# Patient Record
Sex: Male | Born: 1950 | Race: White | Hispanic: No | Marital: Single | Smoking: Former smoker
Health system: Southern US, Community
[De-identification: ages and names within clinical notes are randomized; demographics above are authoritative.]

## PROBLEM LIST (undated history)

## (undated) DIAGNOSIS — M869 Osteomyelitis, unspecified: Secondary | ICD-10-CM

## (undated) DIAGNOSIS — E119 Type 2 diabetes mellitus without complications: Secondary | ICD-10-CM

## (undated) DIAGNOSIS — I739 Peripheral vascular disease, unspecified: Secondary | ICD-10-CM

## (undated) DIAGNOSIS — I10 Essential (primary) hypertension: Secondary | ICD-10-CM

## (undated) HISTORY — PX: TOE AMPUTATION: SHX809

## (undated) HISTORY — PX: TONSILLECTOMY: SUR1361

## (undated) HISTORY — DX: Type 2 diabetes mellitus without complications: E11.9

## (undated) HISTORY — DX: Essential (primary) hypertension: I10

## (undated) HISTORY — DX: Peripheral vascular disease, unspecified: I73.9

---

## 1975-12-31 HISTORY — PX: OPEN TREATMENT ORBITAL FLOOR BLOWOUT: SUR910

## 2015-09-29 ENCOUNTER — Ambulatory Visit (HOSPITAL_COMMUNITY)
Admission: RE | Admit: 2015-09-29 | Discharge: 2015-09-29 | Disposition: A | Discharge status: Home / Self Care | Payer: 59 | Source: Ambulatory Visit | Attending: Family Medicine | Admitting: Family Medicine

## 2015-09-29 ENCOUNTER — Other Ambulatory Visit (HOSPITAL_COMMUNITY): Payer: Self-pay | Admitting: Family Medicine

## 2015-09-29 DIAGNOSIS — Z09 Encounter for follow-up examination after completed treatment for conditions other than malignant neoplasm: Secondary | ICD-10-CM | POA: Insufficient documentation

## 2015-09-29 DIAGNOSIS — Z89412 Acquired absence of left great toe: Secondary | ICD-10-CM | POA: Insufficient documentation

## 2015-09-29 DIAGNOSIS — S98312A Complete traumatic amputation of left midfoot, initial encounter: Secondary | ICD-10-CM

## 2015-12-31 HISTORY — PX: LEG AMPUTATION BELOW KNEE: SHX694

## 2016-08-30 ENCOUNTER — Telehealth (HOSPITAL_COMMUNITY): Payer: Self-pay

## 2016-08-30 NOTE — Telephone Encounter (Signed)
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  I spoke to him and he was just discharged from a medical center today in [place].  He would be coming back home to [place] on 9/3 and had a drs appt with his primary care dr on Tuesday, 9/5.  He said that he figured the dr would look at the knee to make sure everything was healing properly and he would have staples for some time and unsure when he could begin therapy.  I told him that I would scan the paperwork into [employer] and make a note and when he was ready for therapy and decided to come here he could give us a call.

## 2016-09-19 ENCOUNTER — Encounter: Payer: Self-pay | Admitting: Surgery

## 2016-09-25 ENCOUNTER — Encounter: Payer: Self-pay | Admitting: Surgery

## 2016-10-01 ENCOUNTER — Encounter: Payer: Self-pay | Admitting: Surgery

## 2016-10-01 ENCOUNTER — Other Ambulatory Visit: Payer: Self-pay | Admitting: *Deleted

## 2016-10-02 ENCOUNTER — Encounter: Payer: Self-pay | Admitting: Surgery

## 2016-10-02 ENCOUNTER — Ambulatory Visit (INDEPENDENT_AMBULATORY_CARE_PROVIDER_SITE_OTHER): Payer: 59 | Admitting: Surgery

## 2016-10-02 VITALS — BP 140/88 | HR 94 | Temp 98.3°F | Resp 16 | Ht 71.5 in | Wt 155.0 lb

## 2016-10-02 DIAGNOSIS — I70232 Atherosclerosis of native arteries of right leg with ulceration of calf: Secondary | ICD-10-CM

## 2016-10-02 NOTE — Progress Notes (Signed)
[facility]  Patient name: Donald Duran Greening MRN: [id number] DOB: 02/05/1951 Sex: male  REFERRING PHYSICIAN: Dr. Selena BattenKim  REASON FOR CONSULT: Vascular care  HPI: Donald Duran Keeny is a 65 y.o. male, who is referred today for establishing vascular care.  The patient is a Naval architecttruck driver and recently was traveling to [place] where he developed a severe infection of his left leg which led to a 7 day hospital stay and a left below knee amputation.  He reports having had a toe amputation done on the left leg several years ago.  He also has had a ulcer on his right foot which was able to heal.  The patient suffers from diabetes.  He states that his blood sugars range from 108-135.  He is hypertensive but states that his blood pressure is controlled on no medication.  He does have a history of MRSA.  There is a long history of diabetes in his family.  He does complain of neuropathy.  Past Medical History:  Diagnosis Date  . Diabetes mellitus without complication (HCC)   . Hypertension   . Peripheral vascular disease (HCC)     Family History  Problem Relation Age of Onset  . Diabetes Mother   . Cancer Father     SOCIAL HISTORY: Social History   Social History  . Marital status: Single    Spouse name: N/A  . Number of children: N/A  . Years of education: N/A   Occupational History  . Not on file.   Social History Main Topics  . Smoking status: Passive Smoke Exposure - Never Smoker  . Smokeless tobacco: Never Used  . Alcohol use No  . Drug use: No  . Sexual activity: Not on file   Other Topics Concern  . Not on file   Social History Narrative  . No narrative on file    No Known Allergies  Current Outpatient Prescriptions  Medication Sig Dispense Refill  . insulin glargine (LANTUS) 100 UNIT/ML injection Inject into the skin at bedtime.    . metFORMIN (GLUCOPHAGE) 500 MG tablet Take 500 mg by mouth 2 (two) times  daily with a meal.    . traMADol (ULTRAM) 50 MG tablet Take by mouth every 6 (six) hours as needed.     No current facility-administered medications for this visit.     REVIEW OF SYSTEMS:  [X]  denotes positive finding, [ ]  denotes negative finding Cardiac  Comments:  Chest pain or chest pressure:    Shortness of breath upon exertion:    Short of breath when lying flat:    Irregular heart rhythm:        Vascular    Pain in calf, thigh, or hip brought on by ambulation:    Pain in feet at night that wakes you up from your sleep:     Blood clot in your veins:    Leg swelling:         Pulmonary    Oxygen at home:    Productive cough:     Wheezing:         Neurologic    Sudden weakness in arms or legs:     Sudden numbness in arms or legs:     Sudden onset of difficulty speaking or slurred speech:    Temporary loss of vision in one eye:     Problems with dizziness:         Gastrointestinal    Blood in stool:  Vomited blood:         Genitourinary    Burning when urinating:     Blood in urine:        Psychiatric    Major depression:         Hematologic    Bleeding problems:    Problems with blood clotting too easily:        Skin    Rashes or ulcers:        Constitutional    Fever or chills:      PHYSICAL EXAM: Vitals:   10/02/16 1302  BP: 140/88  Pulse: 94  Resp: 16  Temp: 98.3 F (36.8 C)  TempSrc: Oral  SpO2: 98%  Weight: 155 lb (70.3 kg)  Height: 5' 11.5" (1.816 m)    GENERAL: The patient is a well-nourished male, in no acute distress. The vital signs are documented above. CARDIAC: There is a regular rate and rhythm.  VASCULAR: Left below knee amputation is healing appropriately.  There is a eschar over the medial aspect of the incision that has not completely healed.  There is minimal edema.  He can straighten his leg completely.  He has a palpable dorsalis pedis pulse on the right. PULMONARY: There is good air exchange bilaterally without wheezing  or rales. ABDOMEN: Soft and non-tender with normal pitched bowel sounds.  MUSCULOSKELETAL: There are no major deformities or cyanosis. NEUROLOGIC: No focal weakness or paresthesias are detected. SKIN: There are no ulcers or rashes noted. PSYCHIATRIC: The patient has a normal affect.  DATA:  None  ASSESSMENT AND PLAN: History of left below-knee amputation: The patient will be referred to [facility] for discussions regarding stump shrinker and prosthetics.  He will follow-up with me in 1 year for ankle-brachial indices.  I discussed the signs and symptoms for him to look out for so we can avoid having this happen to his right leg.  Given the family history of diabetes and the rather abrupt nature at which she lost his leg, he would probably benefit from a full cardiac workup as well to make sure he doesn't have silent coronary disease.  I would also recommend checking a cholesterol profile starting him on a statin as well as antiplatelet therapy with an aspirin.   Durene Cal, MD [facility] Special[facility] [phone] Pager [phone]

## 2016-10-04 NOTE — Addendum Note (Signed)
Addended by: Yolonda KidaEVANS, ASHLEIGH N on: 10/04/2016 02:16 PM   Modules accepted: Orders

## 2016-10-23 DIAGNOSIS — Z0279 Encounter for issue of other medical certificate: Secondary | ICD-10-CM | POA: Diagnosis not present

## 2017-06-10 LAB — LIPID PANEL
CHOLESTEROL: 154 (ref 0–200)
HDL: 24 — AB (ref 35–70)
Triglycerides: 409 — AB (ref 40–160)

## 2017-06-10 LAB — TSH: TSH: 2.7 (ref <=5.90)

## 2017-06-10 LAB — HEMOGLOBIN A1C: Hemoglobin A1C: 6.8

## 2017-06-10 LAB — BASIC METABOLIC PANEL
BUN: 18 (ref 4–21)
Creatinine: 0.9 (ref <=1.3)

## 2017-08-13 ENCOUNTER — Ambulatory Visit (INDEPENDENT_AMBULATORY_CARE_PROVIDER_SITE_OTHER): Payer: Self-pay | Admitting: "Endocrinology

## 2017-08-13 ENCOUNTER — Encounter: Payer: Self-pay | Admitting: "Endocrinology

## 2017-08-13 VITALS — BP 135/76 | HR 82 | Ht 71.5 in | Wt 181.0 lb

## 2017-08-13 DIAGNOSIS — I10 Essential (primary) hypertension: Secondary | ICD-10-CM | POA: Insufficient documentation

## 2017-08-13 DIAGNOSIS — E1165 Type 2 diabetes mellitus with hyperglycemia: Secondary | ICD-10-CM

## 2017-08-13 DIAGNOSIS — Z794 Long term (current) use of insulin: Secondary | ICD-10-CM

## 2017-08-13 DIAGNOSIS — E1169 Type 2 diabetes mellitus with other specified complication: Secondary | ICD-10-CM

## 2017-08-13 DIAGNOSIS — IMO0002 Reserved for concepts with insufficient information to code with codable children: Secondary | ICD-10-CM | POA: Insufficient documentation

## 2017-08-13 DIAGNOSIS — E782 Mixed hyperlipidemia: Secondary | ICD-10-CM

## 2017-08-13 NOTE — Progress Notes (Signed)
Subjective:    Patient ID: Donald Duran, male    DOB: [birth year]. Patient is being seen in consultation for management of diabetes requested by  Hattie Perch, FNP  Past Medical History:  Diagnosis Date  . Diabetes mellitus without complication (HCC)   . Hypertension   . Peripheral vascular disease [facility])    Past Surgical History:  Procedure Laterality Date  . LEG AMPUTATION BELOW KNEE Left 2017   Done at [facility] in [place] by Dr. Adan Sis  . OPEN TREATMENT ORBITAL FLOOR BLOWOUT  1977  . TOE AMPUTATION Left   . TONSILLECTOMY     Social History   Social History  . Marital status: Single    Spouse name: N/A  . Number of children: N/A  . Years of education: N/A   Social History Main Topics  . Smoking status: Passive Smoke Exposure - Never Smoker  . Smokeless tobacco: Never Used  . Alcohol use No  . Drug use: No  . Sexual activity: Not Asked   Other Topics Concern  . None   Social History Narrative  . None   Outpatient Encounter Prescriptions as of 08/13/2017  Medication Sig  . atorvastatin (LIPITOR) 20 MG tablet Take 20 mg by mouth daily.  . Insulin Glargine (BASAGLAR KWIKPEN [place]) Inject 50 Units into the skin at bedtime.  Marland Kitchen lisinopril (PRINIVIL,ZESTRIL) 10 MG tablet Take 10 mg by mouth daily.  . metFORMIN (GLUCOPHAGE) 500 MG tablet Take 500 mg by mouth 2 (two) times daily with a meal.  . [DISCONTINUED] insulin glargine (LANTUS) 100 UNIT/ML injection Inject into the skin at bedtime.  . [DISCONTINUED] traMADol (ULTRAM) 50 MG tablet Take by mouth every 6 (six) hours as needed.   No facility-administered encounter medications on file as of 08/13/2017.    ALLERGIES: No Known Allergies VACCINATION STATUS:  There is no immunization history on file for this patient.  Diabetes  He presents for his initial diabetic visit. He has type 2 diabetes mellitus. Onset time: He was diagnosed at approximate age of 27 years. His disease course has been  improving (He did have A1c of 9% last year due to labs in his insurance and not affording medications, most recently in June 2018 A1c was 6.8%.). There are no hypoglycemic associated symptoms. Pertinent negatives for hypoglycemia include no confusion, headaches, pallor or seizures. There are no diabetic associated symptoms. Pertinent negatives for diabetes include no chest pain, no fatigue, no polydipsia, no polyphagia, no polyuria and no weakness. There are no hypoglycemic complications. Symptoms are improving. Diabetic complications include PVD. (Patient is status post complicated by MRSA infection leading to amputation of left lower extremity in August 2017 currently wearing prosthetic leg.) Risk factors for coronary artery disease include dyslipidemia, diabetes mellitus and male sex. Current diabetic treatment includes insulin injections and oral agent (monotherapy). His weight is increasing steadily. He is following a generally unhealthy diet. When asked about meal planning, he reported none. He has not had a previous visit with a dietitian. He participates in exercise intermittently. His overall blood glucose range is 140-180 mg/dl. (He did  bring a logs from June 2016 showing average blood glucose between 140 and 180. He admits he does not monitor blood glucose regularly. His A1c was 6.8% on 06/14/2017.) An ACE inhibitor/angiotensin II receptor blocker is being taken. He does not see a podiatrist.Eye exam is not current (He does not have insurance to see an eye doctor.).  Hyperlipidemia  This is a chronic problem.  The current episode started more than 1 year ago. Exacerbating diseases include diabetes. Pertinent negatives include no chest pain, myalgias or shortness of breath. Current antihyperlipidemic treatment includes statins. Risk factors for coronary artery disease include dyslipidemia, diabetes mellitus, hypertension, male sex and a sedentary lifestyle.  Hypertension  This is a chronic problem. The  current episode started more than 1 year ago. The problem is controlled. Pertinent negatives include no chest pain, headaches, neck pain, palpitations or shortness of breath. Risk factors for coronary artery disease include dyslipidemia, diabetes mellitus, family history, male gender and sedentary lifestyle. Past treatments include ACE inhibitors. Hypertensive end-organ damage includes PVD.    Recent Results (from the past 2160 hour(s))  Basic metabolic panel     Status: None   Collection Time: 06/10/17 12:00 AM  Result Value Ref Range   BUN 18 4 - 21   Creatinine 0.9 0.6 - 1.3  Lipid panel     Status: Abnormal   Collection Time: 06/10/17 12:00 AM  Result Value Ref Range   Triglycerides 409 (A) 40 - 160   Cholesterol 154 0 - 200   HDL 24 (A) 35 - 70  Hemoglobin A1c     Status: None   Collection Time: 06/10/17 12:00 AM  Result Value Ref Range   Hemoglobin A1C 6.8   TSH     Status: None   Collection Time: 06/10/17 12:00 AM  Result Value Ref Range   TSH 2.70 0.41 - 5.90   His vitamin D was normal at 32.5 on 06/10/2017.   Review of Systems  Constitutional: Negative for chills, fatigue, fever and unexpected weight change.  HENT: Negative for dental problem, mouth sores and trouble swallowing.   Eyes: Negative for visual disturbance.  Respiratory: Negative for cough, choking, chest tightness, shortness of breath and wheezing.   Cardiovascular: Negative for chest pain, palpitations and leg swelling.  Gastrointestinal: Negative for abdominal distention, abdominal pain, constipation, diarrhea, nausea and vomiting.  Endocrine: Negative for polydipsia, polyphagia and polyuria.  Genitourinary: Negative for dysuria, flank pain, hematuria and urgency.  Musculoskeletal: Positive for gait problem. Negative for back pain, myalgias and neck pain.       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  Skin: Negative for pallor, rash and wound.  Neurological: Negative for seizures, syncope,  weakness, numbness and headaches.  Psychiatric/Behavioral: Negative.  Negative for confusion and dysphoric mood.    Objective:    BP 135/76   Pulse 82   Ht 5' 11.5" (1.816 m)   Wt 181 lb (82.1 kg)   BMI 24.89 kg/m   Wt Readings from Last 3 Encounters:  08/13/17 181 lb (82.1 kg)  10/02/16 155 lb (70.3 kg)    Physical Exam  Constitutional: He is oriented to person, place, and time. He appears well-developed and well-nourished. He is cooperative. No distress.  HENT:  Head: Normocephalic and atraumatic.  Eyes: EOM are normal.  Neck: Normal range of motion. Neck supple. No tracheal deviation present. No thyromegaly present.  Cardiovascular: Normal rate, S1 normal, S2 normal and normal heart sounds.  Exam reveals no gallop.   No murmur heard. Pulses:      Dorsalis pedis pulses are 1+ on the right side, and 1+ on the left side.       Posterior tibial pulses are 1+ on the right side, and 1+ on the left side.  Pulmonary/Chest: Breath sounds normal. No respiratory distress. He has no wheezes.  Abdominal: Soft. Bowel sounds are normal. He exhibits no distension.  There is no tenderness. There is no guarding and no CVA tenderness.  Musculoskeletal: He exhibits no edema.       Right shoulder: He exhibits no swelling and no deformity.  [name] with prosthetic leg, diminished pulses on right lower somebody.  Neurological: He is alert and oriented to person, place, and time. He has normal strength and normal reflexes. No cranial nerve deficit or sensory deficit. Gait normal.  Diminished monofilament response on right lower extremity.  Skin: Skin is warm and dry. No rash noted. No cyanosis. Nails show no clubbing.  Psychiatric: He has a normal mood and affect. His speech is normal. Judgment normal. Cognition and memory are normal.      Assessment & Plan:   1. Uncontrolled type 2 diabetes mellitus with other specified complication, with long-term current use of insulin (HCC)  - Patient has  currently uncontrolled symptomatic type 2 DM since  66 years of age. - His most recent A1c was 6.8% progressively improving from 9% last year. - Recent labs reviewed.  -his diabetes is complicated by severe peripheral diabetes which led to amputation of his left lower extremity, lack of appropriate insurance, peripheral neuropathy and Klye Besecker remains at a high risk for more acute and chronic complications which include CAD, CVA, CKD, retinopathy, and neuropathy. These are all discussed in detail with the patient.  - I have counseled him on diet management and weight control, by adopting a carbohydrate restricted/protein rich diet.  - Suggestion is made for him to avoid simple carbohydrates  from his diet including Cakes, Sweet Desserts, Ice Cream, Soda (diet and regular), Sweet Tea, Candies, Chips, Cookies, Store Bought Juices, Alcohol in Excess of  1-2 drinks a day, Artificial Sweeteners, and "Sugar-free" Products. This will help patient to have stable blood glucose profile and potentially avoid unintended weight gain.  - I encouraged him to switch to  unprocessed or minimally processed complex starch and increased protein intake (animal or plant source), fruits, and vegetables.  - he is advised to stick to a routine mealtimes to eat 3 meals  a day and avoid unnecessary snacks ( to snack only to correct hypoglycemia).   - he will be scheduled with Norm Salt, RDN, CDE for individualized DM education.  - I have approached him with the following individualized plan to manage diabetes and patient agrees:   -  Based on his recent A1c of 6.8%, he would not require additional insulin therapy.  - I have advised him to continue his basal insulin  Basaglar 50 units daily at bedtime, associated with  strict monitoring of glucose 4 times a day-before meals and at bedtime. - She will return in 1 week with his meter and logs for reevaluation. - Patient is warned not to take insulin without  proper monitoring per orders. -Patient is encouraged to call clinic for blood glucose levels less than 70 or above 300 mg /dl. - I will continue metformin 1000 g by mouth twice a day, therapeutically suitable for patient .  -Patient is not a candidate for  SGLT2 inhibitors due to PAD.  - Patient specific target  A1c;  LDL, HDL, Triglycerides, and  Waist Circumference were discussed in detail.  2) BP/HTN: Controlled. Continue current medications including ACEI/ARB. 3) Lipids/HPL:   Uncontrolled with hypertriglyceridemia over 409.   Patient is advised to continue statins. I Have advised him to avoid butter and fried food. 4)  Weight/Diet: CDE Consult will be initiated , exercise, and detailed carbohydrates information provided.  5) Chronic Care/Health Maintenance:  -he  is on ACEI/ARB and Statin medications and  is encouraged to continue to follow up with Ophthalmology, Dentist,  Podiatrist at least yearly or according to recommendations, and advised to  stay away from smoking. I have recommended yearly flu vaccine and pneumonia vaccination at least every 5 years; moderate intensity exercise for up to 150 minutes weekly; and  sleep for at least 7 hours a day. - From diabetes point of view, he is clear for work while taking care of his diabetes based on current plan. 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 Moreover, he will need ophthalmology evaluation as soon as possible.  - Time spent with the patient: 1 hour, of which >50% was spent in obtaining information about his symptoms, reviewing his previous labs, evaluations, and treatments, counseling him about his condition (please see the discussed topics above), and developing a plan for long term treatment; he had a number of questions which I addressed.  - Patient to bring meter and  blood glucose logs during his next visit.  - I advised  patient to maintain close follow up with Hattie Percharter, Joanna L, FNP for primary care needs.  Follow up plan: - Return in about 1 week (around 08/20/2017) for meter, and logs, follow up with meter and logs- no labs.  Marquis LunchGebre Rita Prom, MD Phone: [phone]  Fax: [phone]   08/13/2017, 5:31 PM

## 2017-08-13 NOTE — Patient Instructions (Signed)

## 2017-08-22 ENCOUNTER — Ambulatory Visit: Payer: Self-pay | Admitting: "Endocrinology

## 2017-08-29 ENCOUNTER — Ambulatory Visit: Payer: Self-pay | Admitting: "Endocrinology

## 2017-09-02 ENCOUNTER — Encounter: Payer: Self-pay | Admitting: "Endocrinology

## 2017-09-02 ENCOUNTER — Ambulatory Visit (INDEPENDENT_AMBULATORY_CARE_PROVIDER_SITE_OTHER): Payer: Self-pay | Admitting: "Endocrinology

## 2017-09-02 VITALS — BP 131/75 | HR 86 | Ht 71.5 in | Wt 183.0 lb

## 2017-09-02 DIAGNOSIS — E1169 Type 2 diabetes mellitus with other specified complication: Secondary | ICD-10-CM

## 2017-09-02 DIAGNOSIS — IMO0002 Reserved for concepts with insufficient information to code with codable children: Secondary | ICD-10-CM

## 2017-09-02 DIAGNOSIS — I10 Essential (primary) hypertension: Secondary | ICD-10-CM

## 2017-09-02 DIAGNOSIS — E1165 Type 2 diabetes mellitus with hyperglycemia: Secondary | ICD-10-CM

## 2017-09-02 DIAGNOSIS — E782 Mixed hyperlipidemia: Secondary | ICD-10-CM

## 2017-09-02 DIAGNOSIS — Z794 Long term (current) use of insulin: Secondary | ICD-10-CM

## 2017-09-02 NOTE — Progress Notes (Signed)
Subjective:    Patient ID: Donald Duran, male    DOB: 01/09/1951. Patient is being seen in f/u for management of diabetes requested by  Hattie Percharter, Joanna L, FNP  Past Medical History:  Diagnosis Date  . Diabetes mellitus without complication (HCC)   . Hypertension   . Peripheral vascular disease [facility](HCC)    Past Surgical History:  Procedure Laterality Date  . LEG AMPUTATION BELOW KNEE Left 2017   [facility] in [place] by Dr. Adan SisJeremy Timmer  . OPEN TREATMENT ORBITAL FLOOR BLOWOUT  1977  . TOE AMPUTATION Left   . TONSILLECTOMY     Social History   Social History  . Marital status: Single    Spouse name: N/A  . Number of children: N/A  . Years of education: N/A   Social History Main Topics  . Smoking status: Passive Smoke Exposure - Never Smoker  . Smokeless tobacco: Never Used  . Alcohol use No  . Drug use: No  . Sexual activity: Not on file   Other Topics Concern  . Not on file   Social History Narrative  . No narrative on file   Outpatient Encounter Prescriptions as of 09/02/2017  Medication Sig  . atorvastatin (LIPITOR) 20 MG tablet Take 20 mg by mouth daily.  . Insulin Glargine (BASAGLAR KWIKPEN [place]) Inject 40 Units into the skin at bedtime.  Marland Kitchen. lisinopril (PRINIVIL,ZESTRIL) 10 MG tablet Take 10 mg by mouth daily.  . metFORMIN (GLUCOPHAGE) 500 MG tablet Take 500 mg by mouth 2 (two) times daily with a meal.   No facility-administered encounter medications on file as of 09/02/2017.    ALLERGIES: No Known Allergies VACCINATION STATUS:  There is no immunization history on file for this patient.  Diabetes  He presents for his initial diabetic visit. He has type 2 diabetes mellitus. Onset time: He was diagnosed at approximate age of 66 years. His disease course has been stable (He did have A1c of 9% last year due to labs in his insurance and not affording medications, most recently in June 2018 A1c was 6.8%.). There are no hypoglycemic associated  symptoms. Pertinent negatives for hypoglycemia include no confusion, headaches, pallor or seizures. There are no diabetic associated symptoms. Pertinent negatives for diabetes include no chest pain, no fatigue, no polydipsia, no polyphagia, no polyuria and no weakness. There are no hypoglycemic complications. Symptoms are stable. Diabetic complications include PVD. 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 Current diabetic treatment includes insulin injections and oral agent (monotherapy). His weight is increasing steadily. He is following a generally unhealthy diet. When asked about meal planning, he reported none. He has not had a previous visit with a dietitian. He participates in exercise intermittently. His breakfast blood glucose range is generally 90-110 mg/dl. His lunch blood glucose range is generally 90-110 mg/dl. His dinner blood glucose range is generally 90-110 mg/dl. His bedtime blood glucose range is generally 110-130 mg/dl. His overall blood glucose range is 90-110 mg/dl. (He did  bring a logs from June 2016 showing average blood glucose between 140 and 180. He admits he does not monitor blood glucose regularly. His A1c was 6.8% on 06/14/2017.) An ACE inhibitor/angiotensin II receptor blocker is being taken. He does not see a podiatrist.Eye exam is not current (He does not have insurance to see an eye  doctor.).  Hyperlipidemia  This is a chronic problem. The current episode started more than 1 year ago. Exacerbating diseases include diabetes. Pertinent negatives include no chest pain, myalgias or shortness of breath. Current antihyperlipidemic treatment includes statins. Risk factors for coronary artery disease include dyslipidemia, diabetes mellitus, hypertension, male sex and a sedentary lifestyle.   Hypertension  This is a chronic problem. The current episode started more than 1 year ago. The problem is controlled. Pertinent negatives include no chest pain, headaches, neck pain, palpitations or shortness of breath. Risk factors for coronary artery disease include dyslipidemia, diabetes mellitus, family history, male gender and sedentary lifestyle. Past treatments include ACE inhibitors. Hypertensive end-organ damage includes PVD.    Recent Results (from the past 2160 hour(s))  Basic metabolic panel     Status: None   Collection Time: 06/10/17 12:00 AM  Result Value Ref Range   BUN 18 4 - 21   Creatinine 0.9 0.6 - 1.3  Lipid panel     Status: Abnormal   Collection Time: 06/10/17 12:00 AM  Result Value Ref Range   Triglycerides 409 (A) 40 - 160   Cholesterol 154 0 - 200   HDL 24 (A) 35 - 70  Hemoglobin A1c     Status: None   Collection Time: 06/10/17 12:00 AM  Result Value Ref Range   Hemoglobin A1C 6.8   TSH     Status: None   Collection Time: 06/10/17 12:00 AM  Result Value Ref Range   TSH 2.70 0.41 - 5.90   His vitamin D was normal at 32.5 on 06/10/2017.   Review of Systems  Constitutional: Negative for chills, fatigue, fever and unexpected weight change.  HENT: Negative for dental problem, mouth sores and trouble swallowing.   Eyes: Negative for visual disturbance.  Respiratory: Negative for cough, choking, chest tightness, shortness of breath and wheezing.   Cardiovascular: Negative for chest pain, palpitations and leg swelling.  Gastrointestinal: Negative for abdominal distention, abdominal pain, constipation, diarrhea, nausea and vomiting.  Endocrine: Negative for polydipsia, polyphagia and polyuria.  Genitourinary: Negative for dysuria, flank pain, hematuria and urgency.  Musculoskeletal: Positive for gait problem. Negative for back pain, myalgias and neck pain.       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  Skin: Negative for pallor, rash and wound.   Neurological: Negative for seizures, syncope, weakness, numbness and headaches.  Psychiatric/Behavioral: Negative.  Negative for confusion and dysphoric mood.    Objective:    BP 131/75   Pulse 86   Ht 5' 11.5" (1.816 m)   Wt 183 lb (83 kg)   BMI 25.17 kg/m   Wt Readings from Last 3 Encounters:  09/02/17 183 lb (83 kg)  08/13/17 181 lb (82.1 kg)  10/02/16 155 lb (70.3 kg)    Physical Exam  Constitutional: He is oriented to person, place, and time. He appears well-developed and well-nourished. He is cooperative. No distress.  HENT:  Head: Normocephalic and atraumatic.  Eyes: EOM are normal.  Neck: Normal range of motion. Neck supple. No tracheal deviation present. No thyromegaly present.  Cardiovascular: Normal rate, S1 normal, S2 normal and normal heart sounds.  Exam reveals no gallop.   No murmur heard. Pulses:      Dorsalis pedis pulses are 1+ on the right side, and 1+ on the left side.       Posterior tibial pulses are 1+ on the right side, and 1+ on the left side.  Pulmonary/Chest: Breath sounds normal. No respiratory distress.  He has no wheezes.  Abdominal: Soft. Bowel sounds are normal. He exhibits no distension. There is no tenderness. There is no guarding and no CVA tenderness.  Musculoskeletal: He exhibits no edema.       Right shoulder: He exhibits no swelling and no deformity.  [name] with prosthetic leg, diminished pulses on right lower somebody.  Neurological: He is alert and oriented to person, place, and time. He has normal strength and normal reflexes. No cranial nerve deficit or sensory deficit. Gait normal.  Diminished monofilament response on right lower extremity.  Skin: Skin is warm and dry. No rash noted. No cyanosis. Nails show no clubbing.  Psychiatric: He has a normal mood and affect. His speech is normal. Judgment normal. Cognition and memory are normal.   Recent Results (from the past 2160 hour(s))  Basic metabolic panel     Status: None    Collection Time: 06/10/17 12:00 AM  Result Value Ref Range   BUN 18 4 - 21   Creatinine 0.9 0.6 - 1.3  Lipid panel     Status: Abnormal   Collection Time: 06/10/17 12:00 AM  Result Value Ref Range   Triglycerides 409 (A) 40 - 160   Cholesterol 154 0 - 200   HDL 24 (A) 35 - 70  Hemoglobin A1c     Status: None   Collection Time: 06/10/17 12:00 AM  Result Value Ref Range   Hemoglobin A1C 6.8   TSH     Status: None   Collection Time: 06/10/17 12:00 AM  Result Value Ref Range   TSH 2.70 0.41 - 5.90      Assessment & Plan:   1. Uncontrolled type 2 diabetes mellitus with other specified complication, with long-term current use of insulin (HCC)  - Patient has currently uncontrolled symptomatic type 2 DM since  66 years of age. - His most recent A1c was 6.8% progressively improving from 9% last year. He came with on target blood glucose profile for the last 7 days. - Recent labs reviewed.  -his diabetes is complicated by severe peripheral diabetes which led to amputation of his left lower extremity, lack of appropriate insurance, peripheral neuropathy and [place] Arenson remains at a high risk for more acute and chronic complications which include CAD, CVA, CKD, retinopathy, and neuropathy. These are all discussed in detail with the patient.  - I have counseled him on diet management and weight control, by adopting a carbohydrate restricted/protein rich diet.  -Suggestion is made for him to avoid simple carbohydrates  from his diet including Cakes, Sweet Desserts, Ice Cream, Soda (diet and regular), Sweet Tea, Candies, Chips, Cookies, Store Bought Juices, Alcohol in Excess of  1-2 drinks a day, Artificial Sweeteners, and "Sugar-free" Products. This will help patient to have stable blood glucose profile and potentially avoid unintended weight gain.   - I encouraged him to switch to  unprocessed or minimally processed complex starch and increased protein intake (animal or plant source),  fruits, and vegetables.  - he is advised to stick to a routine mealtimes to eat 3 meals  a day and avoid unnecessary snacks ( to snack only to correct hypoglycemia).   - he will be scheduled with Norm Salt, RDN, CDE for individualized DM education.  - I have approached him with the following individualized plan to manage diabetes and patient agrees:   -  Based on his recent A1c of 6.8%, he would not require additional insulin therapy.  - His overall readings of blood  glucose for the last 7 days are between 90 and 120 mg/dL. He had no hypoglycemia episodes. - I have advised him to lower his basal insulin  Basaglar to 40 units daily at bedtime, associated with  strict monitoring of glucose 2 times a day-before breakfast and at bedtime.  -Patient is encouraged to call clinic for blood glucose levels less than 70 or above 300 mg /dl. - I will continue metformin 1000 g by mouth twice a day, therapeutically suitable for patient .  -Patient is not a candidate for  SGLT2 inhibitors due to PAD.  - Patient specific target  A1c;  LDL, HDL, Triglycerides, and  Waist Circumference were discussed in detail.  2) BP/HTN: Controlled. Continue current medications including ACEI/ARB. 3) Lipids/HPL:   Uncontrolled with hypertriglyceridemia over 409.   Patient is advised to continue statins. I Have advised him to avoid butter and fried food. 4)  Weight/Diet: CDE Consult will be initiated , exercise, and detailed carbohydrates information provided.  5) Chronic Care/Health Maintenance:  -he  is on ACEI/ARB and Statin medications and  is encouraged to continue to follow up with Ophthalmology, Dentist,  Podiatrist at least yearly or according to recommendations, and advised to  stay away from smoking. I have recommended yearly flu vaccine and pneumonia vaccination at least every 5 years; moderate intensity exercise for up to 150 minutes weekly; and  sleep for at least 7 hours a day. - From diabetes point of  view, he is clear for work while taking care of his diabetes based on current plan.  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 Moreover, he will need ophthalmology, PM&R evaluation as soon as possible. 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  - Time spent with the patient: 25 min, of which >50% was spent in reviewing his sugar logs , discussing his hypo- and hyper-glycemic episodes, reviewing his current and  previous labs and insulin doses and developing a plan to avoid hypo- and hyper-glycemia.  - Patient to bring meter and  blood glucose logs during his next visit.  - I advised patient to maintain close follow up with Hattie Perch, FNP for primary care needs.  Follow up plan: - Return in about 8 weeks (around 10/28/2017) for meter, and logs.  Marquis Lunch, MD Phone: [phone]  Fax: [phone]  This note was partially dictated with voice recognition software. Similar sounding words can be transcribed inadequately or may not  be corrected upon review.  09/02/2017, 10:51 AM

## 2017-09-02 NOTE — Patient Instructions (Signed)

## 2017-09-10 ENCOUNTER — Ambulatory Visit: Payer: Self-pay | Admitting: Physician Assistant

## 2017-09-10 ENCOUNTER — Encounter: Payer: Self-pay | Admitting: Physician Assistant

## 2017-09-10 VITALS — BP 110/64 | HR 84 | Temp 98.1°F | Ht 70.5 in | Wt 185.6 lb

## 2017-09-10 DIAGNOSIS — Z125 Encounter for screening for malignant neoplasm of prostate: Secondary | ICD-10-CM

## 2017-09-10 DIAGNOSIS — Z1211 Encounter for screening for malignant neoplasm of colon: Secondary | ICD-10-CM

## 2017-09-10 DIAGNOSIS — I10 Essential (primary) hypertension: Secondary | ICD-10-CM

## 2017-09-10 DIAGNOSIS — E118 Type 2 diabetes mellitus with unspecified complications: Secondary | ICD-10-CM

## 2017-09-10 DIAGNOSIS — Z89512 Acquired absence of left leg below knee: Secondary | ICD-10-CM

## 2017-09-10 DIAGNOSIS — Z794 Long term (current) use of insulin: Secondary | ICD-10-CM

## 2017-09-10 DIAGNOSIS — E782 Mixed hyperlipidemia: Secondary | ICD-10-CM

## 2017-09-10 DIAGNOSIS — E1142 Type 2 diabetes mellitus with diabetic polyneuropathy: Secondary | ICD-10-CM

## 2017-09-10 NOTE — Progress Notes (Signed)
BP 110/64 (BP Location: Left Arm, Patient Position: Sitting, Cuff Size: Normal)   Pulse 84   Temp 98.1 F (36.7 C)   Ht 5' 10.5" (1.791 m)   Wt 185 lb 9.6 oz (84.2 kg)   SpO2 99%   BMI 26.25 kg/m    Subjective:    Patient ID: Donald Duran, male    DOB: 1951-10-22, 66 y.o.   MRN: [id number]  HPI: Donald Duran is a 66 y.o. male presenting on 09/10/2017 for Diabetes and Hypertension   HPI   I am seeing pt at [facility] clinic as interim provider until new permanent provider takes over mid-October  Pt is here today needing an examination requested by occupational health. 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  Dr Fransico Him completed part of the paperwork.    Pt followed by dr Fransico Him for DM.    Pt has follow-up in October with vascular surgeon  Pt has appt with orthopedics for waiver due to amputation (L BKA)  Pt has never seen an eye doctor.    Pt thinks last foot exam was by dr Fransico Him at last OV and vascular surgeon last October.  He is scheduled to get an ABI ordered by dr Fransico Him of the ankle because his pulses felt different.    Pt denies hx MI, cad.    Relevant past medical, surgical, family and social history reviewed and updated as indicated. Interim medical history since our last visit reviewed. Allergies and medications reviewed and updated.   Current Outpatient Prescriptions:  .  aspirin EC 81 MG tablet, Take 81 mg by mouth daily., Disp: , Rfl:  .  atorvastatin (LIPITOR) 20 MG tablet, Take 20 mg by mouth daily., Disp: , Rfl:  .  Insulin Glargine (BASAGLAR KWIKPEN [place]), Inject 40 Units into the skin at bedtime. , Disp: , Rfl:  .  lisinopril (PRINIVIL,ZESTRIL) 10 MG tablet, Take 10 mg by mouth daily., Disp: , Rfl:  .  metFORMIN (GLUCOPHAGE) 500 MG tablet, Take 500 mg by mouth 2 (two) times daily with a meal., Disp: , Rfl:    Review of Systems  Constitutional: Negative for appetite change, chills,  diaphoresis, fatigue, fever and unexpected weight change.  HENT: Negative for congestion, dental problem, drooling, ear pain, facial swelling, hearing loss, mouth sores, sneezing, sore throat, trouble swallowing and voice change.   Eyes: Negative for pain, discharge, redness, itching and visual disturbance.  Respiratory: Negative for cough, choking, shortness of breath and wheezing.   Cardiovascular: Negative for chest pain, palpitations and leg swelling.  Gastrointestinal: Negative for abdominal pain, blood in stool, constipation, diarrhea and vomiting.  Endocrine: Negative for cold intolerance, heat intolerance and polydipsia.  Genitourinary: Negative for decreased urine volume, dysuria and hematuria.  Musculoskeletal: Negative for arthralgias, back pain and gait problem.  Skin: Negative for rash.  Allergic/Immunologic: Negative for environmental allergies.  Neurological: Negative for seizures, syncope, light-headedness and headaches.  Hematological: Negative for adenopathy.  Psychiatric/Behavioral: Negative for agitation, dysphoric mood and suicidal ideas. The patient is not nervous/anxious.     Per HPI unless specifically indicated above     Objective:    BP 110/64 (BP Location: Left Arm, Patient Position: Sitting, Cuff Size: Normal)   Pulse 84   Temp 98.1 F (36.7 C)   Ht 5' 10.5" (1.791 m)   Wt 185 lb 9.6 oz (84.2 kg)   SpO2 99%   BMI 26.25 kg/m   Wt  Readings from Last 3 Encounters:  09/10/17 185 lb 9.6 oz (84.2 kg)  09/02/17 183 lb (83 kg)  08/13/17 181 lb (82.1 kg)    Physical Exam  Constitutional: He is oriented to person, place, and time. He appears well-developed and well-nourished.  HENT:  Head: Normocephalic and atraumatic.  Eyes: Pupils are equal, round, and reactive to light. EOM and lids are normal.  Neck: Neck supple.  Cardiovascular: Normal rate and regular rhythm.   Pulses:      Dorsalis pedis pulses are 1+ on the right side.       Posterior tibial  pulses are 1+ on the right side.  Pulmonary/Chest: Effort normal and breath sounds normal. He has no wheezes.  Abdominal: Soft. Bowel sounds are normal. There is no hepatosplenomegaly. There is no tenderness.  Musculoskeletal: He exhibits no edema.  Lymphadenopathy:    He has no cervical adenopathy.  Neurological: He is alert and oriented to person, place, and time.  Skin: Skin is warm and dry.  Psychiatric: He has a normal mood and affect. His behavior is normal.  Vitals reviewed.    Foot exam done  Results for orders placed or performed in visit on 08/13/17  Basic metabolic panel  Result Value Ref Range   BUN 18 4 - 21   Creatinine 0.9 0.6 - 1.3  Lipid panel  Result Value Ref Range   Triglycerides 409 (A) 40 - 160   Cholesterol 154 0 - 200   HDL 24 (A) 35 - 70  Hemoglobin A1c  Result Value Ref Range   Hemoglobin A1C 6.8   TSH  Result Value Ref Range   TSH 2.70 0.41 - 5.90      Assessment & Plan:    Encounter Diagnoses  Name Primary?  . Controlled type 2 diabetes mellitus with complication, with long-term current use of insulin (HCC)   . Mixed hyperlipidemia   . Essential hypertension, benign   . Diabetic polyneuropathy associated with type 2 diabetes mellitus (HCC)   . S/P BKA (below knee amputation) unilateral, left (HCC)   . Screening for colon cancer   . Screening for prostate cancer Yes    -pt Needs PSA testing- lab ordered -pt Needs colon cancer screening -  iFOBT test ordered -will Refer pt for diabetic eye exam.  -pt has appointment with orthopedics to discuss his waiver -pt has regular follow up scheduled with dr Fransico HimNida for his diabetes -pt paperwork filled out and returned to him -Pt has regular follow-up scheduled here at the end of October.  He is to RTO sooner prn

## 2017-10-01 ENCOUNTER — Other Ambulatory Visit: Payer: Self-pay | Admitting: Physician Assistant

## 2017-10-01 MED ORDER — ONETOUCH LANCETS MISC: 0 refills | Status: DC

## 2017-10-01 MED ORDER — GLUCOSE BLOOD VI STRP: ORAL_STRIP | 1 refills | Status: AC

## 2017-10-02 ENCOUNTER — Other Ambulatory Visit: Payer: Self-pay | Admitting: Physician Assistant

## 2017-10-06 ENCOUNTER — Encounter (HOSPITAL_COMMUNITY): Payer: 59

## 2017-10-06 ENCOUNTER — Ambulatory Visit: Payer: Self-pay | Admitting: Family

## 2017-10-28 ENCOUNTER — Ambulatory Visit: Payer: Self-pay | Admitting: "Endocrinology

## 2017-11-17 ENCOUNTER — Telehealth: Payer: Self-pay

## 2017-11-17 NOTE — Telephone Encounter (Signed)
Called to follow up with [name] for [facility]. Client is currently active as a patient with [facility] and has an upcomming appointment this week. Client's concerns were cost of Diabetic Testing supplies. He is currently using the one touch meter and the meter from [employer] and the reli-on strips. The cost is much less and the meter and strips are accurate and reliable.  Client concerned also about enrolling soon with Medicare. Client has checked with insurance agents and has found they lean to particular insurance companies. RN suggested contacting a local SHIPP representative, usually located at the senior centers where he lives to make an appointment to discuss his medications and his needs. Discussed that the [facility] representative is unbiased and will help look for the right plan for him. Client very thankful for the information. Client also [facility]. He states he received the flyer in the mail. Reminded client to think of other questions he may have that we can help assist in resources for him. Client very complementary of the [facility] Program.

## 2017-11-25 DIAGNOSIS — Z139 Encounter for screening, unspecified: Secondary | ICD-10-CM

## 2017-11-25 NOTE — Congregational Nurse Program (Signed)
Congregational Nurse Program Note  Date of Encounter: 11/25/2017  Past Medical History: Past Medical History:  Diagnosis Date  . Diabetes mellitus without complication (HCC)   . Hypertension   . Peripheral vascular disease [facility](HCC)     Encounter Details: CNP Questionnaire - 11/25/17 1550      Questionnaire   Patient Status  Not Applicable    Race  White or Caucasian    [facility] Patient [facility]    Insurance  Not Applicable    Uninsured  Uninsured (NEW 1x/quarter)    Food  No food insecurities    Transportation  No transportation needs    Interpersonal Safety  Yes, feel physically and emotionally safe where you currently live    Medication  No medication insecurities    Medical Provider  Yes    Referrals  Not Applicable    ED Visit Averted  Not Applicable    Life-Saving Intervention Made  Not Applicable       66 year old pt who at some point was connected with [facility] scheduled an appointment for his flu vaccine.   Pt stated he does not work as has no Community education officerinsurance.  [facility]   Pt states NKDA at this time.   Medical History: Diabetes.  Surgical History: Prosthetic lower left limb extremity due infection complicated by diabetes.   Blood sugar this AM 122. Advised to take both metformin and insulin, to take medications as directed, and keep appointment with PCP.     Vitals: B/P: 126/74 Pulse: 82 Temp: 98.1/oral  Flu vaccine was given to patient on left deltoid. After vaccine was given pt was asked to stay 15 minutes after. Pt agreed. No visible adverse reactions to the vaccine. No allergic reaction voiced.  Vaccine info: Lot: #[id number] MFG: (IIV) [employer] Fluarix 2018-19 NDC: [id number] Expires: 06/28/18  Pt was advised of minimal pain at site of injection. If patient needed he could take tylenol or ibuprofen and also to move the arm more, as well ice at the site of injection.   Mentioned to pt about  [place] cares in regards to HIV, hepatitis C, and syphillis test as well as the Warden/rangersocial worker intern. Pt stated he did not need it at this time.

## 2018-01-05 ENCOUNTER — Ambulatory Visit: Payer: Self-pay | Admitting: Family

## 2018-01-05 ENCOUNTER — Encounter (HOSPITAL_COMMUNITY): Payer: Self-pay

## 2019-11-27 ENCOUNTER — Emergency Department (HOSPITAL_COMMUNITY): Payer: Medicare Other

## 2019-11-27 ENCOUNTER — Encounter (HOSPITAL_COMMUNITY): Payer: Self-pay | Admitting: Emergency Medicine

## 2019-11-27 ENCOUNTER — Other Ambulatory Visit: Payer: Self-pay

## 2019-11-27 ENCOUNTER — Emergency Department (HOSPITAL_COMMUNITY)
Admission: EM | Admit: 2019-11-27 | Discharge: 2019-11-27 | Disposition: A | Discharge status: Home / Self Care | Payer: Medicare Other | Attending: Emergency Medicine | Admitting: Emergency Medicine

## 2019-11-27 DIAGNOSIS — Z7982 Long term (current) use of aspirin: Secondary | ICD-10-CM | POA: Diagnosis not present

## 2019-11-27 DIAGNOSIS — Z7984 Long term (current) use of oral hypoglycemic drugs: Secondary | ICD-10-CM | POA: Insufficient documentation

## 2019-11-27 DIAGNOSIS — L97519 Non-pressure chronic ulcer of other part of right foot with unspecified severity: Secondary | ICD-10-CM | POA: Insufficient documentation

## 2019-11-27 DIAGNOSIS — L03031 Cellulitis of right toe: Secondary | ICD-10-CM | POA: Diagnosis not present

## 2019-11-27 DIAGNOSIS — I10 Essential (primary) hypertension: Secondary | ICD-10-CM | POA: Diagnosis not present

## 2019-11-27 DIAGNOSIS — Z7722 Contact with and (suspected) exposure to environmental tobacco smoke (acute) (chronic): Secondary | ICD-10-CM | POA: Diagnosis not present

## 2019-11-27 DIAGNOSIS — Z79899 Other long term (current) drug therapy: Secondary | ICD-10-CM | POA: Insufficient documentation

## 2019-11-27 DIAGNOSIS — E11621 Type 2 diabetes mellitus with foot ulcer: Secondary | ICD-10-CM | POA: Diagnosis not present

## 2019-11-27 DIAGNOSIS — Z89512 Acquired absence of left leg below knee: Secondary | ICD-10-CM | POA: Insufficient documentation

## 2019-11-27 DIAGNOSIS — L03818 Cellulitis of other sites: Secondary | ICD-10-CM

## 2019-11-27 DIAGNOSIS — L089 Local infection of the skin and subcutaneous tissue, unspecified: Secondary | ICD-10-CM | POA: Diagnosis present

## 2019-11-27 DIAGNOSIS — I70209 Unspecified atherosclerosis of native arteries of extremities, unspecified extremity: Secondary | ICD-10-CM | POA: Insufficient documentation

## 2019-11-27 LAB — CBC WITH DIFFERENTIAL/PLATELET
Abs Immature Granulocytes: 0.02 10*3/uL (ref 0.00–0.07)
Basophils Absolute: 0.1 10*3/uL (ref 0.0–0.1)
Basophils Relative: 1 %
Eosinophils Absolute: 0.5 10*3/uL (ref 0.0–0.5)
Eosinophils Relative: 5 %
HCT: 42.3 % (ref 39.0–52.0)
Hemoglobin: 14.2 g/dL (ref 13.0–17.0)
Immature Granulocytes: 0 %
Lymphocytes Relative: 23 %
Lymphs Abs: 2.2 10*3/uL (ref 0.7–4.0)
MCH: 31.4 pg (ref 26.0–34.0)
MCHC: 33.6 g/dL (ref 30.0–36.0)
MCV: 93.6 fL (ref 80.0–100.0)
Monocytes Absolute: 0.9 10*3/uL (ref 0.1–1.0)
Monocytes Relative: 10 %
Neutro Abs: 5.7 10*3/uL (ref 1.7–7.7)
Neutrophils Relative %: 61 %
Platelets: 222 10*3/uL (ref 150–400)
RBC: 4.52 MIL/uL (ref 4.22–5.81)
RDW: 12.2 % (ref 11.5–15.5)
WBC: 9.4 10*3/uL (ref 4.0–10.5)
nRBC: 0 % (ref 0.0–0.2)

## 2019-11-27 LAB — BASIC METABOLIC PANEL
Anion gap: 9 (ref 5–15)
BUN: 16 mg/dL (ref 8–23)
CO2: 24 mmol/L (ref 22–32)
Calcium: 9.2 mg/dL (ref 8.9–10.3)
Chloride: 106 mmol/L (ref 98–111)
Creatinine, Ser: 0.84 mg/dL (ref 0.61–1.24)
GFR calc Af Amer: >60 mL/min (ref >60)
GFR calc non Af Amer: >60 mL/min (ref >60)
Glucose, Bld: 87 mg/dL (ref 70–99)
Potassium: 4.4 mmol/L (ref 3.5–5.1)
Sodium: 139 mmol/L (ref 135–145)

## 2019-11-27 MED ORDER — PIPERACILLIN-TAZOBACTAM 3.375 G IVPB 30 MIN
3.3750 g | Freq: Once | INTRAVENOUS | Status: AC
  Administered 2019-11-27: 3.375 g via INTRAVENOUS
  Filled 2019-11-27: qty 50

## 2019-11-27 MED ORDER — CEPHALEXIN 500 MG PO CAPS: 500.0000 mg | ORAL_CAPSULE | Freq: Four times a day (QID) | ORAL | 0 refills | Status: AC

## 2019-11-27 NOTE — ED Triage Notes (Signed)
Pt states that he thinks the fourth toe on his right foot is infected.

## 2019-11-27 NOTE — Discharge Instructions (Addendum)
You were given a prescription for antibiotics. Please take the antibiotic prescription fully.   You were given a referral to the wound care center. Please call the office to set up an appointment for next week. If you are unable to be seen by the wound care center you should call your primary care provider to set up an appointment for follow up within the next 3-5 days.   Please return to the emergency room immediately if you experience any new or worsening symptoms or any symptoms that indicate worsening infection such as fevers, increased redness/swelling/pain, warmth, or drainage from the affected area.

## 2019-11-27 NOTE — ED Provider Notes (Signed)
[facility] EMERGENCY DEPARTMENT Provider Note   CSN: [id number] Arrival date & time: 11/27/19  1527     History   Chief Complaint Chief Complaint  Patient presents with  . Cellulitis    HPI Donald Duran is a 68 y.o. male.     HPI   Patient is a 68 year old male with a history of diabetes, hypertension, peripheral vascular disease, hyperlipidemia, [name], who presents the emergency department today for evaluation of toe infection.  States that he has been working a week and has been wearing steel toed boots.  He noticed that about 5 days ago he had wounds to several of his toes.  He started using naproxen and making sure to clean them regularly.  He states that the wounds on his first and second toes have improved significantly however his fourth toe appears red today and there is an ulceration on the plantar surface of the foot.  He denies any fevers.  He does not have any sensation to this part of his foot due to history of neuropathy.  States blood sugars have been under good control recently.  Past Medical History:  Diagnosis Date  . Diabetes mellitus without complication ([place])   . Hypertension   . Peripheral vascular disease [facility])     Patient Active Problem List   Diagnosis Date Noted  . Type II diabetes mellitus, uncontrolled ([place]) 08/13/2017  . Essential hypertension, benign 08/13/2017  . Mixed hyperlipidemia 08/13/2017    Past Surgical History:  Procedure Laterality Date  . LEG AMPUTATION BELOW KNEE Left 2017   Done at [facility] system in [place] by Dr. Shara Blazing  . OPEN TREATMENT ORBITAL FLOOR [place]  . TOE AMPUTATION Left   . TONSILLECTOMY          Home Medications    Prior to Admission medications   Medication Sig Start Date End Date Taking? Authorizing Provider  aspirin EC 81 MG tablet Take 81 mg by mouth daily.   Yes [provider]  atorvastatin (LIPITOR) 20 MG tablet Take 20 mg by mouth daily.   Yes [provider]  Insulin Glargine (BASAGLAR KWIKPEN [place]) Inject 40 Units into the skin at bedtime.    Yes [provider]  lisinopril (ZESTRIL) 20 MG tablet Take 20 mg by mouth daily. 06/18/19  Yes [provider]  metFORMIN (GLUCOPHAGE) 500 MG tablet Take 500 mg by mouth 2 (two) times daily with a meal.   Yes [provider]  cephALEXin (KEFLEX) 500 MG capsule Take 1 capsule (500 mg total) by mouth 4 (four) times daily for 10 days. 11/27/19 12/07/19  Jule Whitsel S, PA-C  glucose blood test strip Use as instructed 10/01/17   Soyla Dryer, PA-C  ONE TOUCH LANCETS MISC Check bs as directed 10/01/17   Soyla Dryer, PA-C    Family History Family History  Problem Relation Age of Onset  . Diabetes Mother   . Cancer Father     Social History Social History   Tobacco Use  . Smoking status: Passive Smoke Exposure - Never Smoker  . Smokeless tobacco: Never Used  Substance Use Topics  . Alcohol use: No  . Drug use: No     Allergies   Patient has no known allergies.   Review of Systems Review of Systems  Constitutional: Negative for fever.  HENT: Negative for sore throat.   Eyes: Negative for visual disturbance.  Respiratory: Negative for cough and shortness of breath.  Cardiovascular: Negative for chest pain.  Gastrointestinal: Negative for abdominal pain, constipation, diarrhea, nausea and vomiting.  Genitourinary: Negative for dysuria and hematuria.  Musculoskeletal:       Right foot wounds  Skin: Positive for wound.  Neurological: Positive for numbness (chronic).  All other systems reviewed and are negative.   Physical Exam Updated Vital Signs BP 122/80 (BP Location: Right Arm)   Pulse 79   Temp 98.5 F (36.9 C) (Oral)   Resp 18   Ht 5' 11.5" (1.816 m)   Wt 84.4 kg   SpO2 100%   BMI 25.58 kg/m   Physical Exam Constitutional:      General: He is not in acute distress.    Appearance: He is well-developed.  Eyes:      Conjunctiva/sclera: Conjunctivae normal.  Cardiovascular:     Rate and Rhythm: Normal rate and regular rhythm.  Pulmonary:     Effort: Pulmonary effort is normal.     Breath sounds: Normal breath sounds.  Musculoskeletal:     Comments: Superficial abrasion over the right great toe with mild surrounding cellulitis. 2nd toe nail is partially avulsed, superficial abrasion proximal to the toenail. 4th digit with 1cm ulceration to the plantar surface of the toe. No obvious bony involvement. Skin maceration present. No pus drainage. dp pulse intact. Brisk cap refill to all toes.   Skin:    General: Skin is warm and dry.  Neurological:     Mental Status: He is alert and oriented to person, place, and time.             ED Treatments / Results  Labs (all labs ordered are listed, but only abnormal results are displayed) Labs Reviewed  CULTURE, BLOOD (ROUTINE X 2)  CULTURE, BLOOD (ROUTINE X 2)  CBC WITH DIFFERENTIAL/PLATELET  BASIC METABOLIC PANEL    EKG None  Radiology Dg Foot Complete Right  Result Date: 11/27/2019 CLINICAL DATA:  Infection right fourth toe with redness and swelling. Multiple sores on the right foot. EXAM: RIGHT FOOT COMPLETE - 3+ VIEW COMPARISON:  None. FINDINGS: Vascular calcifications are identified. No fractures are seen. No bony erosion to suggest osteomyelitis. No soft tissue gas identified. IMPRESSION: No evidence of osteomyelitis. Electronically Signed   By: Gerome Sam III M.D   On: 11/27/2019 17:15    Procedures Procedures (including critical care time)  Medications Ordered in ED Medications  piperacillin-tazobactam (ZOSYN) IVPB 3.375 g (0 g Intravenous Stopped 11/27/19 1817)     Initial Impression / Assessment and Plan / ED Course  I have reviewed the triage vital signs and the nursing notes.  Pertinent labs & imaging results that were available during my care of the patient were reviewed by me and considered in my medical decision making  (see chart for details).  Final Clinical Impressions(s) / ED Diagnoses   Final diagnoses:  Cellulitis of other specified site   68 y/o male with a h/o DM presenting to the ED for eval of toe wounds that started this week.   Pt afebrile, normal vitals.  Cbc w/o leukocytosis, no anemia Bmp with normal cr, electrolytes, and normal blood glucose.   Xray w/o findings of osteomyelitis.   Will start pt on keflex and refer to wound care center. Advised close f/u with wound care and/or pcp. Advised on strict monitoring of sxs and strict return precautions. He voices understanding and is in agreement with plan. All questions answered, pt stable for d.c   Care discussed with Dr. Juleen China, supervising  physician who is in agreement with plan.   ED Discharge Orders         Ordered    cephALEXin (KEFLEX) 500 MG capsule  4 times daily     11/27/19 1816           Rayne DuCouture, Carsten Carstarphen S, PA-C 11/27/19 2208    Raeford RazorKohut, Stephen, MD 11/27/19 2330

## 2019-12-02 LAB — CULTURE, BLOOD (ROUTINE X 2)
Culture: NO GROWTH
Culture: NO GROWTH
Special Requests: ADEQUATE
Special Requests: ADEQUATE

## 2020-01-14 ENCOUNTER — Encounter (HOSPITAL_COMMUNITY): Payer: Self-pay | Admitting: Emergency Medicine

## 2020-01-14 ENCOUNTER — Emergency Department (HOSPITAL_COMMUNITY): Payer: Medicare Other

## 2020-01-14 ENCOUNTER — Other Ambulatory Visit: Payer: Self-pay

## 2020-01-14 DIAGNOSIS — Z8614 Personal history of Methicillin resistant Staphylococcus aureus infection: Secondary | ICD-10-CM

## 2020-01-14 DIAGNOSIS — Z7982 Long term (current) use of aspirin: Secondary | ICD-10-CM

## 2020-01-14 DIAGNOSIS — E1165 Type 2 diabetes mellitus with hyperglycemia: Secondary | ICD-10-CM | POA: Diagnosis present

## 2020-01-14 DIAGNOSIS — E1151 Type 2 diabetes mellitus with diabetic peripheral angiopathy without gangrene: Secondary | ICD-10-CM | POA: Diagnosis present

## 2020-01-14 DIAGNOSIS — E11621 Type 2 diabetes mellitus with foot ulcer: Secondary | ICD-10-CM | POA: Diagnosis present

## 2020-01-14 DIAGNOSIS — E1169 Type 2 diabetes mellitus with other specified complication: Secondary | ICD-10-CM | POA: Diagnosis not present

## 2020-01-14 DIAGNOSIS — Z89512 Acquired absence of left leg below knee: Secondary | ICD-10-CM

## 2020-01-14 DIAGNOSIS — S92531A Displaced fracture of distal phalanx of right lesser toe(s), initial encounter for closed fracture: Secondary | ICD-10-CM | POA: Diagnosis present

## 2020-01-14 DIAGNOSIS — Z794 Long term (current) use of insulin: Secondary | ICD-10-CM

## 2020-01-14 DIAGNOSIS — L03115 Cellulitis of right lower limb: Secondary | ICD-10-CM | POA: Diagnosis present

## 2020-01-14 DIAGNOSIS — Z20822 Contact with and (suspected) exposure to covid-19: Secondary | ICD-10-CM | POA: Diagnosis present

## 2020-01-14 DIAGNOSIS — M86171 Other acute osteomyelitis, right ankle and foot: Secondary | ICD-10-CM | POA: Diagnosis present

## 2020-01-14 DIAGNOSIS — L97519 Non-pressure chronic ulcer of other part of right foot with unspecified severity: Secondary | ICD-10-CM | POA: Diagnosis present

## 2020-01-14 DIAGNOSIS — E782 Mixed hyperlipidemia: Secondary | ICD-10-CM | POA: Diagnosis present

## 2020-01-14 DIAGNOSIS — Z79899 Other long term (current) drug therapy: Secondary | ICD-10-CM

## 2020-01-14 DIAGNOSIS — X58XXXA Exposure to other specified factors, initial encounter: Secondary | ICD-10-CM | POA: Diagnosis present

## 2020-01-14 DIAGNOSIS — M009 Pyogenic arthritis, unspecified: Secondary | ICD-10-CM | POA: Diagnosis present

## 2020-01-14 DIAGNOSIS — Z833 Family history of diabetes mellitus: Secondary | ICD-10-CM

## 2020-01-14 DIAGNOSIS — I10 Essential (primary) hypertension: Secondary | ICD-10-CM | POA: Diagnosis present

## 2020-01-14 DIAGNOSIS — E11628 Type 2 diabetes mellitus with other skin complications: Secondary | ICD-10-CM | POA: Diagnosis present

## 2020-01-14 DIAGNOSIS — Z9089 Acquired absence of other organs: Secondary | ICD-10-CM

## 2020-01-14 DIAGNOSIS — Z7722 Contact with and (suspected) exposure to environmental tobacco smoke (acute) (chronic): Secondary | ICD-10-CM | POA: Diagnosis present

## 2020-01-14 DIAGNOSIS — L0390 Cellulitis, unspecified: Secondary | ICD-10-CM | POA: Diagnosis not present

## 2020-01-14 LAB — CBG MONITORING, ED: Glucose-Capillary: 122 mg/dL — ABNORMAL HIGH (ref 70–99)

## 2020-01-14 NOTE — ED Triage Notes (Signed)
Pt reports RLE swelling, redness to right foot. Swelling, warmth, wound noted to right great toe. Discoloration, skin breakdown also noted to right fifth toe. Pt reports was seen and treated x5 weeks ago for cellulites in RLE. Pt denies any n/v,fever. Reports intermittent chills.

## 2020-01-15 ENCOUNTER — Inpatient Hospital Stay (HOSPITAL_COMMUNITY)
Admission: EM | Admit: 2020-01-15 | Discharge: 2020-01-19 | DRG: 638 | Disposition: A | Discharge status: Home Health | Payer: Medicare Other | Attending: Internal Medicine | Admitting: Internal Medicine

## 2020-01-15 ENCOUNTER — Encounter (HOSPITAL_COMMUNITY): Payer: Self-pay | Admitting: Internal Medicine

## 2020-01-15 DIAGNOSIS — I10 Essential (primary) hypertension: Secondary | ICD-10-CM | POA: Diagnosis present

## 2020-01-15 DIAGNOSIS — Z7982 Long term (current) use of aspirin: Secondary | ICD-10-CM | POA: Diagnosis not present

## 2020-01-15 DIAGNOSIS — Z9089 Acquired absence of other organs: Secondary | ICD-10-CM | POA: Diagnosis not present

## 2020-01-15 DIAGNOSIS — X58XXXA Exposure to other specified factors, initial encounter: Secondary | ICD-10-CM | POA: Diagnosis not present

## 2020-01-15 DIAGNOSIS — M86171 Other acute osteomyelitis, right ankle and foot: Secondary | ICD-10-CM | POA: Diagnosis present

## 2020-01-15 DIAGNOSIS — Z794 Long term (current) use of insulin: Secondary | ICD-10-CM | POA: Diagnosis not present

## 2020-01-15 DIAGNOSIS — L0390 Cellulitis, unspecified: Secondary | ICD-10-CM | POA: Diagnosis present

## 2020-01-15 DIAGNOSIS — L97519 Non-pressure chronic ulcer of other part of right foot with unspecified severity: Secondary | ICD-10-CM | POA: Diagnosis present

## 2020-01-15 DIAGNOSIS — M869 Osteomyelitis, unspecified: Secondary | ICD-10-CM | POA: Insufficient documentation

## 2020-01-15 DIAGNOSIS — L089 Local infection of the skin and subcutaneous tissue, unspecified: Secondary | ICD-10-CM | POA: Diagnosis not present

## 2020-01-15 DIAGNOSIS — I70235 Atherosclerosis of native arteries of right leg with ulceration of other part of foot: Secondary | ICD-10-CM

## 2020-01-15 DIAGNOSIS — S92531A Displaced fracture of distal phalanx of right lesser toe(s), initial encounter for closed fracture: Secondary | ICD-10-CM | POA: Diagnosis not present

## 2020-01-15 DIAGNOSIS — E119 Type 2 diabetes mellitus without complications: Secondary | ICD-10-CM

## 2020-01-15 DIAGNOSIS — IMO0002 Reserved for concepts with insufficient information to code with codable children: Secondary | ICD-10-CM | POA: Diagnosis present

## 2020-01-15 DIAGNOSIS — L03115 Cellulitis of right lower limb: Secondary | ICD-10-CM | POA: Diagnosis present

## 2020-01-15 DIAGNOSIS — E1169 Type 2 diabetes mellitus with other specified complication: Secondary | ICD-10-CM | POA: Diagnosis present

## 2020-01-15 DIAGNOSIS — E782 Mixed hyperlipidemia: Secondary | ICD-10-CM | POA: Diagnosis present

## 2020-01-15 DIAGNOSIS — I96 Gangrene, not elsewhere classified: Secondary | ICD-10-CM | POA: Diagnosis not present

## 2020-01-15 DIAGNOSIS — Z8614 Personal history of Methicillin resistant Staphylococcus aureus infection: Secondary | ICD-10-CM | POA: Diagnosis not present

## 2020-01-15 DIAGNOSIS — Z79899 Other long term (current) drug therapy: Secondary | ICD-10-CM | POA: Diagnosis not present

## 2020-01-15 DIAGNOSIS — E11628 Type 2 diabetes mellitus with other skin complications: Secondary | ICD-10-CM | POA: Diagnosis present

## 2020-01-15 DIAGNOSIS — Z20822 Contact with and (suspected) exposure to covid-19: Secondary | ICD-10-CM | POA: Diagnosis not present

## 2020-01-15 DIAGNOSIS — I739 Peripheral vascular disease, unspecified: Secondary | ICD-10-CM

## 2020-01-15 DIAGNOSIS — E1165 Type 2 diabetes mellitus with hyperglycemia: Secondary | ICD-10-CM | POA: Diagnosis present

## 2020-01-15 DIAGNOSIS — Z833 Family history of diabetes mellitus: Secondary | ICD-10-CM | POA: Diagnosis not present

## 2020-01-15 DIAGNOSIS — Z7722 Contact with and (suspected) exposure to environmental tobacco smoke (acute) (chronic): Secondary | ICD-10-CM | POA: Diagnosis not present

## 2020-01-15 DIAGNOSIS — M009 Pyogenic arthritis, unspecified: Secondary | ICD-10-CM | POA: Diagnosis not present

## 2020-01-15 DIAGNOSIS — E11621 Type 2 diabetes mellitus with foot ulcer: Secondary | ICD-10-CM | POA: Diagnosis present

## 2020-01-15 DIAGNOSIS — Z89512 Acquired absence of left leg below knee: Secondary | ICD-10-CM | POA: Diagnosis not present

## 2020-01-15 DIAGNOSIS — E1151 Type 2 diabetes mellitus with diabetic peripheral angiopathy without gangrene: Secondary | ICD-10-CM | POA: Diagnosis not present

## 2020-01-15 HISTORY — DX: Osteomyelitis, unspecified: M86.9

## 2020-01-15 LAB — CBC WITH DIFFERENTIAL/PLATELET
Abs Immature Granulocytes: 0.03 10*3/uL (ref 0.00–0.07)
Basophils Absolute: 0.1 10*3/uL (ref 0.0–0.1)
Basophils Relative: 1 %
Eosinophils Absolute: 0.4 10*3/uL (ref 0.0–0.5)
Eosinophils Relative: 4 %
HCT: 40.2 % (ref 39.0–52.0)
Hemoglobin: 13 g/dL (ref 13.0–17.0)
Immature Granulocytes: 0 %
Lymphocytes Relative: 26 %
Lymphs Abs: 2.6 10*3/uL (ref 0.7–4.0)
MCH: 30.5 pg (ref 26.0–34.0)
MCHC: 32.3 g/dL (ref 30.0–36.0)
MCV: 94.4 fL (ref 80.0–100.0)
Monocytes Absolute: 0.9 10*3/uL (ref 0.1–1.0)
Monocytes Relative: 9 %
Neutro Abs: 6 10*3/uL (ref 1.7–7.7)
Neutrophils Relative %: 60 %
Platelets: 261 10*3/uL (ref 150–400)
RBC: 4.26 MIL/uL (ref 4.22–5.81)
RDW: 12.3 % (ref 11.5–15.5)
WBC: 9.9 10*3/uL (ref 4.0–10.5)
nRBC: 0 % (ref 0.0–0.2)

## 2020-01-15 LAB — RESPIRATORY PANEL BY RT PCR (FLU A&B, COVID)
Influenza A by PCR: NEGATIVE
Influenza B by PCR: NEGATIVE
SARS Coronavirus 2 by RT PCR: NEGATIVE

## 2020-01-15 LAB — BASIC METABOLIC PANEL
Anion gap: 10 (ref 5–15)
BUN: 31 mg/dL — ABNORMAL HIGH (ref 8–23)
CO2: 25 mmol/L (ref 22–32)
Calcium: 9.1 mg/dL (ref 8.9–10.3)
Chloride: 101 mmol/L (ref 98–111)
Creatinine, Ser: 1 mg/dL (ref 0.61–1.24)
GFR calc Af Amer: >60 mL/min (ref >60)
GFR calc non Af Amer: >60 mL/min (ref >60)
Glucose, Bld: 241 mg/dL — ABNORMAL HIGH (ref 70–99)
Potassium: 4 mmol/L (ref 3.5–5.1)
Sodium: 136 mmol/L (ref 135–145)

## 2020-01-15 LAB — CBG MONITORING, ED
Glucose-Capillary: 245 mg/dL — ABNORMAL HIGH (ref 70–99)
Glucose-Capillary: 170 mg/dL — ABNORMAL HIGH (ref 70–99)

## 2020-01-15 LAB — GLUCOSE, CAPILLARY
Glucose-Capillary: 161 mg/dL — ABNORMAL HIGH (ref 70–99)
Glucose-Capillary: 155 mg/dL — ABNORMAL HIGH (ref 70–99)

## 2020-01-15 LAB — SEDIMENTATION RATE: Sed Rate: 50 mm/hr — ABNORMAL HIGH (ref 0–16)

## 2020-01-15 LAB — HEMOGLOBIN A1C
Hgb A1c MFr Bld: 7 % — ABNORMAL HIGH (ref 4.8–5.6)
Mean Plasma Glucose: 154.2 mg/dL

## 2020-01-15 LAB — C-REACTIVE PROTEIN: CRP: 1.3 mg/dL — ABNORMAL HIGH (ref <1.0)

## 2020-01-15 MED ORDER — SODIUM CHLORIDE 0.9 % IV SOLN: 250.0000 mL | INTRAVENOUS | Status: DC | PRN

## 2020-01-15 MED ORDER — ASPIRIN EC 81 MG PO TBEC
81.0000 mg | DELAYED_RELEASE_TABLET | Freq: Every day | ORAL | Status: DC
  Administered 2020-01-15 – 2020-01-19 (×5): 81 mg via ORAL
  Filled 2020-01-15 (×5): qty 1

## 2020-01-15 MED ORDER — SODIUM CHLORIDE 0.9% FLUSH: 3.0000 mL | INTRAVENOUS | Status: DC | PRN

## 2020-01-15 MED ORDER — ACETAMINOPHEN 650 MG RE SUPP: 650.0000 mg | Freq: Four times a day (QID) | RECTAL | Status: DC | PRN

## 2020-01-15 MED ORDER — PIPERACILLIN-TAZOBACTAM 3.375 G IVPB 30 MIN
3.3750 g | Freq: Once | INTRAVENOUS | Status: AC
  Administered 2020-01-15: 3.375 g via INTRAVENOUS
  Filled 2020-01-15: qty 50

## 2020-01-15 MED ORDER — PIPERACILLIN-TAZOBACTAM 3.375 G IVPB
3.3750 g | Freq: Three times a day (TID) | INTRAVENOUS | Status: DC
  Administered 2020-01-15 – 2020-01-18 (×9): 3.375 g via INTRAVENOUS
  Filled 2020-01-15 (×9): qty 50

## 2020-01-15 MED ORDER — ENOXAPARIN SODIUM 40 MG/0.4ML ~~LOC~~ SOLN
40.0000 mg | SUBCUTANEOUS | Status: DC
  Administered 2020-01-15 – 2020-01-19 (×5): 40 mg via SUBCUTANEOUS
  Filled 2020-01-15 (×5): qty 0.4

## 2020-01-15 MED ORDER — VANCOMYCIN HCL IN DEXTROSE 1-5 GM/200ML-% IV SOLN
1000.0000 mg | Freq: Two times a day (BID) | INTRAVENOUS | Status: DC
  Administered 2020-01-15 – 2020-01-18 (×6): 1000 mg via INTRAVENOUS
  Filled 2020-01-15 (×9): qty 200

## 2020-01-15 MED ORDER — SODIUM CHLORIDE 0.9% FLUSH
3.0000 mL | Freq: Two times a day (BID) | INTRAVENOUS | Status: DC
  Administered 2020-01-15 – 2020-01-17 (×3): 3 mL via INTRAVENOUS

## 2020-01-15 MED ORDER — INSULIN ASPART 100 UNIT/ML ~~LOC~~ SOLN
0.0000 [IU] | Freq: Three times a day (TID) | SUBCUTANEOUS | Status: DC
  Administered 2020-01-15: 3 [IU] via SUBCUTANEOUS
  Filled 2020-01-15: qty 1

## 2020-01-15 MED ORDER — METFORMIN HCL 500 MG PO TABS
500.0000 mg | ORAL_TABLET | Freq: Two times a day (BID) | ORAL | Status: DC
  Administered 2020-01-15: 09:00:00 500 mg via ORAL
  Filled 2020-01-15: qty 1

## 2020-01-15 MED ORDER — ATORVASTATIN CALCIUM 10 MG PO TABS
20.0000 mg | ORAL_TABLET | Freq: Every day | ORAL | Status: DC
  Administered 2020-01-15 – 2020-01-19 (×5): 20 mg via ORAL
  Filled 2020-01-15 (×5): qty 2

## 2020-01-15 MED ORDER — INSULIN ASPART 100 UNIT/ML ~~LOC~~ SOLN: 0.0000 [IU] | Freq: Every day | SUBCUTANEOUS | Status: DC

## 2020-01-15 MED ORDER — INSULIN GLARGINE 100 UNIT/ML ~~LOC~~ SOLN
15.0000 [IU] | Freq: Every day | SUBCUTANEOUS | Status: DC
  Administered 2020-01-15 – 2020-01-19 (×5): 15 [IU] via SUBCUTANEOUS
  Filled 2020-01-15 (×7): qty 0.15

## 2020-01-15 MED ORDER — INSULIN ASPART 100 UNIT/ML ~~LOC~~ SOLN
0.0000 [IU] | Freq: Every day | SUBCUTANEOUS | Status: DC
  Administered 2020-01-17: 3 [IU] via SUBCUTANEOUS

## 2020-01-15 MED ORDER — VANCOMYCIN HCL IN DEXTROSE 1-5 GM/200ML-% IV SOLN
1000.0000 mg | INTRAVENOUS | Status: AC
  Administered 2020-01-15 (×2): 1000 mg via INTRAVENOUS
  Filled 2020-01-15 (×2): qty 200

## 2020-01-15 MED ORDER — ACETAMINOPHEN 325 MG PO TABS: 650.0000 mg | ORAL_TABLET | Freq: Four times a day (QID) | ORAL | Status: DC | PRN

## 2020-01-15 MED ORDER — LISINOPRIL 20 MG PO TABS
20.0000 mg | ORAL_TABLET | Freq: Every day | ORAL | Status: DC
  Administered 2020-01-15 – 2020-01-19 (×5): 20 mg via ORAL
  Filled 2020-01-15: qty 2
  Filled 2020-01-15 (×4): qty 1

## 2020-01-15 MED ORDER — INSULIN ASPART 100 UNIT/ML ~~LOC~~ SOLN
0.0000 [IU] | Freq: Three times a day (TID) | SUBCUTANEOUS | Status: DC
  Administered 2020-01-15 (×2): 3 [IU] via SUBCUTANEOUS
  Administered 2020-01-16: 2 [IU] via SUBCUTANEOUS
  Administered 2020-01-16: 3 [IU] via SUBCUTANEOUS
  Administered 2020-01-16: 2 [IU] via SUBCUTANEOUS
  Administered 2020-01-17: 3 [IU] via SUBCUTANEOUS
  Administered 2020-01-18: 2 [IU] via SUBCUTANEOUS
  Administered 2020-01-18: 8 [IU] via SUBCUTANEOUS
  Administered 2020-01-18 – 2020-01-19 (×3): 3 [IU] via SUBCUTANEOUS
  Filled 2020-01-15: qty 1

## 2020-01-15 NOTE — Progress Notes (Addendum)
Pharmacy Antibiotic Note  Donald Duran is a 69 y.o. male admitted on 01/15/2020 with cellulitis.  Pharmacy has been consulted for vancomycin  and Zosyn dosing.  Plan: Start Zosyn 3.375g IV q8h (4-hr infusion) Loading dose:   vancomycin 1g  IV q1h x2 doses for a total dose of 2g Maintenance dose:   vancomycin 1g IV q12h Goal vancomycin trough range:   15-20 mcg/mL Pharmacy will continue to monitor renal function, vancomycin troughs as clinically appropriate,  cultures and patient progress.  Height: 5\' 11"  (180.3 cm) Weight: 182 lb 15.7 oz (83 kg) IBW/kg (Calculated) : 75.3  Temp (24hrs), Avg:98.5 F (36.9 C), Min:98.5 F (36.9 C), Max:98.5 F (36.9 C)  Recent Labs  Lab 01/15/20 0038  WBC 9.9  CREATININE 1.00    Estimated Creatinine Clearance: 77.4 mL/min (by C-G formula based on SCr of 1 mg/dL).    No Known Allergies  Antimicrobials this admission: Zosyn 1/16 >>      vancomycin 1/16>>     Microbiology results:  1/16 Resp PCR: SARS CoV-2 negative; Flu A/B negative    Thank you for allowing pharmacy to be a part of this patient's care.  2/16 01/15/2020 3:41 AM

## 2020-01-15 NOTE — ED Notes (Signed)
Patient given sandwich and soda. 

## 2020-01-15 NOTE — Progress Notes (Addendum)
PROGRESS NOTE  Donald Duran WRU:[id number] DOB: May 13, 1951 DOA: 01/15/2020 PCP: Wyatt Haste, NP  Brief History:  69 y/o male with a history of [name] in 2017, peripheral vascular disease presenting with erythema, drainage, and pain on his right foot particularly his fourth toe.  The patient states that he was seen in the ED on 11/27/2019 for his diabetic foot infection involving the right fourth toe at that time.  He was discharged home with cephalexin.  Apparently it had gradually improved but not completely resolved in terms of this ulceration or erythema.  He was placed on another course of cephalexin by his PCP.  Once again there was some mild improvement while he was on antibiotics.  He finished antibiotics approximately 10 days prior to this admission.  Since then, he has noted increasing erythema, drainage, and foul odor.  As result he presented for further evaluation.  The patient denies any fevers, chills, headache, chest pain, shortness breath, nausea, vomit, diarrhea, abdominal pain.  There is no dysuria or hematuria.  He denies any new medications. In the emergency department, the patient was afebrile hemodynamically stable with oxygen saturation 98% room air.  BMP and CBC were essentially unremarkable with WBC 9.9.  X-ray of the right foot was suspicious for osteomyelitis of the distal fifth toe and fourth middle phalanx.  ESR 50, CRP 1.3.  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 Trula Slade in the past on 10/02/2016, but he was lost to follow-up and never followed through with ABIs. -Check ABI -Plan to transfer to [facility] -Case discussed with with VVS, Dr. Arthor Captain see after transfer -Continue vanco and zosyn  Essential hypertension  -Continue lisinopril  Uncontrolled diabetes mellitus type 2 with hyperglycemia -Hemoglobin A1c -Start reduced dose Lantus -NovoLog sliding scale -Holding metformin  Peripheral vascular disease -Vascular surgery, Dr. Carlis Abbott consulted as discussed above -Continue aspirin  Hyperlipidemia -Continue statin       Disposition Plan:   Transfer to [facility] Communication:   Family at bedside  Consultants:  VVS--Dr. Carlis Abbott  Code Status:  FULL   DVT Prophylaxis:  [place] Lovenox   Procedures: As Listed in Progress Note Above  Antibiotics: vanco 1/16>>> Zosyn 1/16>>>    Total time spent 35 minutes.  Greater than 50% spent face to face counseling and coordinating care.   Subjective: Patient denies fevers, chills, headache, chest pain, dyspnea, nausea, vomiting, diarrhea, abdominal pain, dysuria, hematuria, hematochezia, and melena. He complains of mild pain in his right fourth toe with malodorous drainage. Objective: Vitals:   01/15/20 0530 01/15/20 0600 01/15/20 0630 01/15/20 0730  BP: 118/75 123/67 125/68 121/65  Pulse: 81 81 73 80  Resp:    16  Temp:      TempSrc:      SpO2: 97% 94% 93% 97%  Weight:      Height:        Intake/Output Summary (Last 24 hours) at 01/15/2020 0837 Last data filed at 01/15/2020 9147 Gross per 24 hour  Intake 300 ml  Output -  Net 300 ml   Weight change:  Exam:   General:  Pt is alert, follows commands appropriately, not in acute distress  HEENT: No icterus, No thrush, No neck mass, [place]/AT  Cardiovascular: RRR, S1/S2, no rubs, no gallops  Respiratory: CTA bilaterally, no wheezing, no crackles, no rhonchi  Abdomen: Soft/+BS, non tender, non distended, no guarding  Extremities: See pictures below of right foot       Data Reviewed: I have personally reviewed following labs and imaging studies Basic Metabolic Panel: Recent Labs  Lab 01/15/20 0038  NA 136  K 4.0  CL 101  CO2 25  GLUCOSE 241*  BUN 31*   CREATININE 1.00  CALCIUM 9.1   Liver Function Tests: No results for input(s): AST, ALT, ALKPHOS, BILITOT, PROT, ALBUMIN in the last 168 hours. No results for input(s): LIPASE, AMYLASE in the last 168 hours. No results for input(s): AMMONIA in the last 168 hours. Coagulation Profile: No results for input(s): INR, PROTIME in the last 168 hours. CBC: Recent Labs  Lab 01/15/20 0038  WBC 9.9  NEUTROABS 6.0  HGB 13.0  HCT 40.2  MCV 94.4  PLT 261   Cardiac Enzymes: No results for input(s): CKTOTAL, CKMB, CKMBINDEX, TROPONINI in the last 168 hours. BNP: Invalid input(s): POCBNP CBG: Recent Labs  Lab 01/14/20 1817 01/15/20 0821  GLUCAP 122* 245*   HbA1C: No results for input(s): HGBA1C in the last 72 hours. Urine analysis: No results found for: COLORURINE, APPEARANCEUR, LABSPEC, PHURINE, GLUCOSEU, HGBUR, BILIRUBINUR, KETONESUR, PROTEINUR, UROBILINOGEN, NITRITE, LEUKOCYTESUR Sepsis Labs: '@LABRCNTIP' (procalcitonin:4,lacticidven:4) ) Recent Results (from the past 240 hour(s))  Respiratory Panel by RT PCR (Flu A&B, Covid) - Nasopharyngeal Swab     Status: None   Collection Time: 01/15/20  1:20 AM   Specimen: Nasopharyngeal Swab  Result Value Ref Range Status   SARS Coronavirus 2 by RT PCR NEGATIVE NEGATIVE Final    Comment: (NOTE) SARS-CoV-2 target nucleic acids are NOT DETECTED. The SARS-CoV-2 RNA is generally detectable in upper respiratoy specimens during the acute phase of infection. The lowest concentration of SARS-CoV-2 viral copies this assay can detect is 131 copies/mL. A negative result does not preclude SARS-Cov-2 infection and should not be used as the sole basis for treatment or other patient management decisions. A negative result may occur with  improper specimen collection/handling, submission of specimen other than nasopharyngeal swab, presence of viral mutation(s) within the areas targeted by this assay, and inadequate number of viral copies (<131  copies/mL). A negative result must be combined with clinical observations, patient history, and epidemiological information. The expected result is Negative. Fact Sheet for Patients:  PinkCheek.be Fact Sheet for Healthcare Providers:  GravelBags.it This test is not yet ap proved or cleared by the [place] FDA and  has been authorized for detection and/or diagnosis of SARS-CoV-2 by FDA under an Emergency Use Authorization (EUA). This EUA will remain  in effect (meaning this test can be used) for the duration of the COVID-19 declaration under Section 564(b)(1) of the Act, 21 U.S.C. section 360bbb-3(b)(1), unless the authorization is terminated or revoked sooner.    Influenza A by PCR NEGATIVE NEGATIVE Final   Influenza B by PCR NEGATIVE NEGATIVE Final    Comment: (NOTE) The Xpert Xpress SARS-CoV-2/FLU/RSV assay is intended as an aid in  the diagnosis of influenza from Nasopharyngeal swab specimens and  should not be used as a sole basis for treatment. Nasal washings and  aspirates are unacceptable for Xpert Xpress SARS-CoV-2/FLU/RSV  testing. Fact Sheet for Patients: PinkCheek.be Fact Sheet for Healthcare Providers: GravelBags.it This test is not yet approved or cleared by the [place] FDA and  has been authorized for detection and/or diagnosis of SARS-CoV-2 by  FDA under an Emergency Use Authorization (EUA). This  EUA will remain  in effect (meaning this test can be used) for the duration of the  Covid-19 declaration under Section 564(b)(1) of the Act, 21  U.S.C. section 360bbb-3(b)(1), unless the authorization is  terminated or revoked. Performed at [facility], [address], [place], [place] [address]      Scheduled Meds: . aspirin EC  81 mg Oral Daily  . atorvastatin  20 mg Oral Daily  . enoxaparin (LOVENOX) injection  40 mg Subcutaneous Q24H  .  insulin aspart  0-5 Units Subcutaneous QHS  . insulin aspart  0-9 Units Subcutaneous TID WC  . lisinopril  20 mg Oral Daily  . metFORMIN  500 mg Oral BID WC  . sodium chloride flush  3 mL Intravenous Q12H   Continuous Infusions: . sodium chloride    . piperacillin-tazobactam (ZOSYN)  IV    . vancomycin      Procedures/Studies: DG Foot Complete Right  Result Date: 01/14/2020 CLINICAL DATA:  Foot wound. Right foot swelling and redness. Wound about the right great toe. Skin breakdown about the fifth toe. History of diabetes. EXAM: RIGHT FOOT COMPLETE - 3+ VIEW COMPARISON:  Slight radiograph 11/27/2019 FINDINGS: Soft tissue defect about the plantar aspect of the great toe. No associated periosteal reaction or abnormal bone density. Skin irregularity with soft tissue air about the distal fifth toe, erosion noted of the fifth toe distal phalanx suspicious for osteomyelitis. Fourth digit partially obscured by overlap from the fifth toe, lucency through the fourth toe middle phalanx and decreased density of the fourth toe distal phalanx suspicious for osteomyelitis and septic joint. No radiopaque foreign body. Scattered osteoarthritis throughout the foot. Advanced vascular calcifications. IMPRESSION: 1. Findings suspicious for osteomyelitis of the fifth toe distal tuft and fourth toe middle phalanx and proximal interphalangeal joint. Associated soft tissue defect about the distal fifth digit. 2. Soft tissue ulcer about the first digit but no radiographic evidence of osteomyelitis at this site. Electronically Signed   By: Keith Rake M.D.   On: 01/14/2020 19:57    Orson Eva, DO  [facility] Pager [phone]  If 7PM-7AM, please contact night-coverage [email or web address] Password [facility] 01/15/2020, 8:37 AM   LOS: 0 days

## 2020-01-15 NOTE — ED Provider Notes (Addendum)
[facility] EMERGENCY DEPARTMENT Provider Note   CSN: [id number] Arrival date & time: 01/14/20  1620   Time seen 12:45 PM  History Chief Complaint  Patient presents with  . Recurrent Skin Infections    Donald Duran is a 69 y.o. male.  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  He states he was seen in ED about 6 weeks ago which was November 28 for an infection in his right foot.  He was treated with Keflex for 3 weeks.  He states it seemed to be better but he still had some redness around a few of his toes.  Yesterday however he reports it got worse with the redness spreading and having increased drainage.  He states he thought he saw a black area on his big toe.  He states he had undocumented fever 2 or 3 days ago without chills.  He states he has had a mild cough this week that he attributes to having heat in his house.  He states he had a sore throat 78 days ago and it only lasted 1 to 2 days and it is gone.  Patient does not smoke.  Patient does not know if he is ever had an MRI, he states he had some facial fractures in 1976 and has a lot of wires in his left face.  PCP Wyatt Haste, NP   Past Medical History:  Diagnosis Date  . Diabetes mellitus without complication ([place])   . Hypertension   . Peripheral vascular disease [facility])     Patient Active Problem List   Diagnosis Date Noted  . Cellulitis 01/15/2020  . Type II diabetes mellitus, uncontrolled ([place]) 08/13/2017  . Essential hypertension, benign 08/13/2017  . Mixed hyperlipidemia 08/13/2017    Past Surgical History:  Procedure Laterality Date  . LEG AMPUTATION BELOW KNEE Left 2017   Done at [facility] system in [place] by Dr. Shara Blazing  . OPEN TREATMENT ORBITAL FLOOR [place]  . TOE AMPUTATION Left   . TONSILLECTOMY         Family History  Problem Relation Age of Onset  . Diabetes Mother   . Cancer Father     Social History   Tobacco Use  .  Smoking status: Passive Smoke Exposure - Never Smoker  . Smokeless tobacco: Never Used  Substance Use Topics  . Alcohol use: No  . Drug use: No    Home Medications Prior to Admission medications   Medication Sig Start Date End Date Taking? Authorizing Provider  aspirin EC 81 MG tablet Take 81 mg by mouth daily.    [provider]  atorvastatin (LIPITOR) 20 MG tablet Take 20 mg by mouth daily.    [provider]  glucose blood test strip Use as instructed 10/01/17   Soyla Dryer, PA-C  Insulin Glargine (BASAGLAR KWIKPEN [place]) Inject 40 Units into the skin at bedtime.     [provider]  lisinopril (ZESTRIL) 20 MG tablet Take 20 mg by mouth daily. 06/18/19   [provider]  metFORMIN (GLUCOPHAGE) 500 MG tablet Take 500 mg by mouth 2 (two) times daily with a meal.    [provider]  ONE TOUCH LANCETS MISC Check bs as directed 10/01/17   Soyla Dryer, PA-C    Allergies    Patient has no known allergies.  Review of Systems   Review of Systems  All other systems reviewed and are negative.  Physical Exam Updated Vital Signs BP 134/71 (BP Location: Right Arm)   Pulse 88   Temp 98.5 F (36.9 C) (Oral)   Resp 16   Ht 5\' 11"  (1.803 m)   Wt 83 kg   SpO2 97%   BMI 25.52 kg/m   Physical Exam Vitals and nursing note reviewed.  Constitutional:      General: He is not in acute distress.    Appearance: Normal appearance. He is well-developed. He is not ill-appearing or toxic-appearing.  HENT:     Head: Normocephalic and atraumatic.     Right Ear: External ear normal.     Left Ear: External ear normal.     Nose: No mucosal edema.     Mouth/Throat:     Dentition: No dental abscesses.     Pharynx: No uvula swelling.  Eyes:     Extraocular Movements: Extraocular movements intact.     Pupils: Pupils are equal, round, and reactive to light.  Cardiovascular:     Rate and Rhythm: Normal rate and regular rhythm.     Heart sounds:  Normal heart sounds.  Pulmonary:     Effort: Pulmonary effort is normal. No respiratory distress.     Breath sounds: Normal breath sounds.  Chest:     Chest wall: No crepitus.  Musculoskeletal:        General: Swelling present. No tenderness. Normal range of motion.     Cervical back: Full passive range of motion without pain, normal range of motion and neck supple.     Comments: Moves all extremities well.  Patient is status post [name].  He has a ulcer on the volar aspect of his right great toe.  He has redness of the distal right great toe, and a lot of redness and swelling of his fourth toe with drainage seen between the fourth and fifth toe.  There is redness on his foot around the MTPs of the second through fifth toes that extends into the distal foot..  Skin:    General: Skin is warm and dry.     Coloration: Skin is not pale.     Findings: Erythema and lesion present. No rash.  Neurological:     General: No focal deficit present.     Mental Status: He is alert and oriented to person, place, and time.     Cranial Nerves: No cranial nerve deficit.  Psychiatric:        Mood and Affect: Mood normal. Mood is not anxious.        Speech: Speech normal.        Behavior: Behavior normal.        Thought Content: Thought content normal.          ED Results / Procedures / Treatments   Labs (all labs ordered are listed, but only abnormal results are displayed) Results for orders placed or performed during the hospital encounter of 01/15/20  Respiratory Panel by RT PCR (Flu A&B, Covid) - Nasopharyngeal Swab   Specimen: Nasopharyngeal Swab  Result Value Ref Range   SARS Coronavirus 2 by RT PCR NEGATIVE NEGATIVE   Influenza A by PCR NEGATIVE NEGATIVE   Influenza B by PCR NEGATIVE NEGATIVE  CBC with Differential  Result Value Ref Range   WBC 9.9 4.0 - 10.5 K/uL   RBC 4.26 4.22 - 5.81 MIL/uL   Hemoglobin 13.0 13.0 - 17.0 g/dL   HCT 01/17/20 82.6 - 41.5 %   MCV 94.4 80.0 - 100.0 fL  MCH 30.5 26.0 - 34.0 pg   MCHC 32.3 30.0 - 36.0 g/dL   RDW 33.8 25.0 - 53.9 %   Platelets 261 150 - 400 K/uL   nRBC 0.0 0.0 - 0.2 %   Neutrophils Relative % 60 %   Neutro Abs 6.0 1.7 - 7.7 K/uL   Lymphocytes Relative 26 %   Lymphs Abs 2.6 0.7 - 4.0 K/uL   Monocytes Relative 9 %   Monocytes Absolute 0.9 0.1 - 1.0 K/uL   Eosinophils Relative 4 %   Eosinophils Absolute 0.4 0.0 - 0.5 K/uL   Basophils Relative 1 %   Basophils Absolute 0.1 0.0 - 0.1 K/uL   Immature Granulocytes 0 %   Abs Immature Granulocytes 0.03 0.00 - 0.07 K/uL  Basic metabolic panel  Result Value Ref Range   Sodium 136 135 - 145 mmol/L   Potassium 4.0 3.5 - 5.1 mmol/L   Chloride 101 98 - 111 mmol/L   CO2 25 22 - 32 mmol/L   Glucose, Bld 241 (H) 70 - 99 mg/dL   BUN 31 (H) 8 - 23 mg/dL   Creatinine, Ser 7.67 0.61 - 1.24 mg/dL   Calcium 9.1 8.9 - 34.1 mg/dL   GFR calc non Af Amer >60 >60 mL/min   GFR calc Af Amer >60 >60 mL/min   Anion gap 10 5 - 15  Sedimentation rate  Result Value Ref Range   Sed Rate 50 (H) 0 - 16 mm/hr  C-reactive protein  Result Value Ref Range   CRP 1.3 (H) <1.0 mg/dL  CBG monitoring, ED  Result Value Ref Range   Glucose-Capillary 122 (H) 70 - 99 mg/dL   Laboratory interpretation all normal except hyperglycemia, mild elevation of CRP, elevated sed rate    EKG None  Radiology DG Foot Complete Right  Result Date: 01/14/2020 CLINICAL DATA:  Foot wound. Right foot swelling and redness. Wound about the right great toe. Skin breakdown about the fifth toe. History of diabetes. EXAM: RIGHT FOOT COMPLETE - 3+ VIEW COMPARISON:  Slight radiograph 11/27/2019 FINDINGS: Soft tissue defect about the plantar aspect of the great toe. No associated periosteal reaction or abnormal bone density. Skin irregularity with soft tissue air about the distal fifth toe, erosion noted of the fifth toe distal phalanx suspicious for osteomyelitis. Fourth digit partially obscured by overlap from the fifth toe,  lucency through the fourth toe middle phalanx and decreased density of the fourth toe distal phalanx suspicious for osteomyelitis and septic joint. No radiopaque foreign body. Scattered osteoarthritis throughout the foot. Advanced vascular calcifications. IMPRESSION: 1. Findings suspicious for osteomyelitis of the fifth toe distal tuft and fourth toe middle phalanx and proximal interphalangeal joint. Associated soft tissue defect about the distal fifth digit. 2. Soft tissue ulcer about the first digit but no radiographic evidence of osteomyelitis at this site. Electronically Signed   By: Narda Rutherford M.D.   On: 01/14/2020 19:57    Procedures .Critical Care Performed by: Devoria Albe, MD Authorized by: Devoria Albe, MD   Critical care provider statement:    Critical care time (minutes):  35   Critical care was necessary to treat or prevent imminent or life-threatening deterioration of the following conditions:  Circulatory failure   Critical care was time spent personally by me on the following activities:  Discussions with consultants, examination of patient, obtaining history from patient or surrogate, ordering and review of radiographic studies, ordering and review of laboratory studies and review of old charts   (including critical  care time)  Medications Ordered in ED Medications  piperacillin-tazobactam (ZOSYN) IVPB 3.375 g (has no administration in time range)  Vancomycin per pharmacy  ED Course  I have reviewed the triage vital signs and the nursing notes.  Pertinent labs & imaging results that were available during my care of the patient were reviewed by me and considered in my medical decision making (see chart for details).    MDM Rules/Calculators/A&P                      IV was inserted, I did not start antibiotics until I talked to the admitting doctor to make sure they did not want to obtain cultures first.  2:48 AM Dr. Selena Batten, hospitalist will admit.  He is agreeable to  starting antibiotics including coverage for MRSA.  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   Final Clinical Impression(s) / ED Diagnoses Final diagnoses:  Other acute osteomyelitis of right foot [facility])    Rx / DC Orders Plan admission  Devoria Albe, MD, Concha Pyo, MD 01/15/20 1484    Devoria Albe, MD 02/14/20 2330

## 2020-01-15 NOTE — Consult Note (Signed)
Hospital Consult    Reason for Consult: Critical limb ischemia of the right lower extremity with tissue loss Referring Physician: Transfer from [facility] MRN #:  [id number]  History of Present Illness: This is a 69 y.o. male with history of hypertension and diabetes that presents as a transfer from [facility] for evaluation of critical limb ischemia of the right lower extremity with tissue loss.  Patient states he has had a wound on his right great toe as well as an ulcer between the fourth and fifth toe over the last 6 weeks.  He states he has been on antibiotics for about 3 weeks.  The wound has not made any significant progress but he does not think it has gotten any worse.  He has never had any vascular intervention in the right lower extremity.  He was previously a Naval architect.  He previously underwent  [name] that he states was about 3 years ago at an outside institution.  Denies tobacco abuse.  States his blood sugars are very well controlled and his HgbA1c normally less than 7.  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  Denies numbness or weakness in the right foot.    Past Medical History:  Diagnosis Date  . Diabetes mellitus without complication ([facility])   . Hypertension   . Osteomyelitis ([facility])   . Peripheral vascular disease [facility])     Past Surgical History:  Procedure Laterality Date  . LEG AMPUTATION BELOW KNEE Left 2017   Done at [facility] in [place] by Dr. Adan Sis  . OPEN TREATMENT ORBITAL FLOOR BLOWOUT  1977  . TOE AMPUTATION Left   . TONSILLECTOMY      No Known Allergies  Prior to Admission medications   Medication Sig Start Date End Date Taking? Authorizing Provider  aspirin EC 81 MG tablet Take 81 mg by mouth daily.    [provider]  atorvastatin (LIPITOR) 20 MG tablet Take 20 mg by mouth daily.    [provider]  glucose blood test strip Use as instructed 10/01/17   Jacquelin Hawking, PA-C  Insulin Glargine  (BASAGLAR KWIKPEN [place]) Inject 40 Units into the skin at bedtime.     [provider]  lisinopril (ZESTRIL) 20 MG tablet Take 20 mg by mouth daily. 06/18/19   [provider]  metFORMIN (GLUCOPHAGE) 500 MG tablet Take 500 mg by mouth 2 (two) times daily with a meal.    [provider]  ONE TOUCH LANCETS MISC Check bs as directed 10/01/17   Jacquelin Hawking, PA-C    Social History   Socioeconomic History  . Marital status: Single    Spouse name: Not on file  . Number of children: Not on file  . Years of education: Not on file  . Highest education level: Not on file  Occupational History  . Not on file  Tobacco Use  . Smoking status: Passive Smoke Exposure - Never Smoker  . Smokeless tobacco: Never Used  Substance and Sexual Activity  . Alcohol use: No  . Drug use: No  . Sexual activity: Not on file  Other Topics Concern  . Not on file  Social History Narrative  . Not on file   Social Determinants of Health   Financial Resource Strain:   . Difficulty of Paying Living Expenses: Not on file  Food Insecurity:   . Worried About Programme researcher, broadcasting/film/video in the Last Year: Not on file  .  Ran Out of Food in the Last Year: Not on file  Transportation Needs:   . Lack of Transportation (Medical): Not on file  . Lack of Transportation (Non-Medical): Not on file  Physical Activity:   . Days of Exercise per Week: Not on file  . Minutes of Exercise per Session: Not on file  Stress:   . Feeling of Stress : Not on file  Social Connections:   . Frequency of Communication with Friends and Family: Not on file  . Frequency of Social Gatherings with Friends and Family: Not on file  . Attends Religious Services: Not on file  . Active Member of Clubs or Organizations: Not on file  . Attends Banker Meetings: Not on file  . Marital Status: Not on file  Intimate Partner Violence:   . Fear of Current or Ex-Partner: Not on file  . Emotionally Abused: Not on file    . Physically Abused: Not on file  . Sexually Abused: Not on file     Family History  Problem Relation Age of Onset  . Diabetes Mother   . Cancer Father     ROS: [x]  Positive   [ ]  Negative   [ ]  All sytems reviewed and are negative  Cardiovascular: []  chest pain/pressure []  palpitations []  SOB lying flat []  DOE []  pain in legs while walking []  pain in legs at rest []  pain in legs at night []  non-healing ulcers []  hx of DVT []  swelling in legs  Pulmonary: []  productive cough []  asthma/wheezing []  home O2  Neurologic: []  weakness in []  arms []  legs []  numbness in []  arms []  legs []  hx of CVA []  mini stroke [] difficulty speaking or slurred speech []  temporary loss of vision in one eye []  dizziness  Hematologic: []  hx of cancer []  bleeding problems []  problems with blood clotting easily  Endocrine:   []  diabetes []  thyroid disease  GI []  vomiting blood []  blood in stool  GU: []  CKD/renal failure []  HD--[]  M/W/F or []  T/T/S []  burning with urination []  blood in urine  Psychiatric: []  anxiety []  depression  Musculoskeletal: []  arthritis []  joint pain  Integumentary: []  rashes []  ulcers  Constitutional: []  fever []  chills   Physical Examination  Vitals:   01/15/20 1449 01/15/20 1531  BP:  (!) 114/59  Pulse:  79  Resp:  18  Temp: 98.1 F (36.7 C) 98.6 F (37 C)  SpO2:  96%   Body mass index is 25.52 kg/m.  General:  WDWN in NAD Gait: Not observed HENT: WNL, normocephalic Pulmonary: normal non-labored breathing, without Rales, rhonchi,  wheezing Cardiac: regular, without  Murmurs, rubs or gallops Abdomen: soft, NT/ND, no masses Vascular Exam/Pulses: Palpable femoral pulses bilaterally 2+ Palpable popliteal pulses bilaterally 2+ [name] Right great toe dry ulcer on base and ulcer between right 4th/5th toes Musculoskeletal: no muscle wasting or atrophy  Neurologic: A&O X 3; Appropriate Affect ; SENSATION: normal; MOTOR FUNCTION:   moving all extremities equally. Speech is fluent/normal      CBC    Component Value Date/Time   WBC 9.9 01/15/2020 0038   RBC 4.26 01/15/2020 0038   HGB 13.0 01/15/2020 0038   HCT 40.2 01/15/2020 0038   PLT 261 01/15/2020 0038   MCV 94.4 01/15/2020 0038   MCH 30.5 01/15/2020 0038   MCHC 32.3 01/15/2020 0038   RDW 12.3 01/15/2020 0038   LYMPHSABS 2.6 01/15/2020 0038   MONOABS 0.9 01/15/2020 0038   EOSABS 0.4 01/15/2020  0038   BASOSABS 0.1 01/15/2020 0038    BMET    Component Value Date/Time   NA 136 01/15/2020 0038   K 4.0 01/15/2020 0038   CL 101 01/15/2020 0038   CO2 25 01/15/2020 0038   GLUCOSE 241 (H) 01/15/2020 0038   BUN 31 (H) 01/15/2020 0038   BUN 18 06/10/2017 0000   CREATININE 1.00 01/15/2020 0038   CALCIUM 9.1 01/15/2020 0038   GFRNONAA >60 01/15/2020 0038   GFRAA >60 01/15/2020 0038    COAGS: No results found for: INR, PROTIME   Non-Invasive Vascular Imaging:    ABIs pending   ASSESSMENT/PLAN: This is a 69 y.o. male with history of diabetes and hypertension that presents with tissue loss of the right lower extremity and concern for underlying PAD.  I have ordered ABIs to get baseline evaluation.  I do think he has a weakly palpable dorsalis pedis pulse on exam.  He is motor and sensory intact.  This has been ongoing for the last 6 weeks.  Likely will need right lower extremity arteriogram with possible intervention this week and discussed this could be all small vessel disease in setting of his diabetes.  Would be okay to continue antibiotics in the interim.  Vascular will continue to follow.  Marty Heck, MD [facility] Office: [place]

## 2020-01-15 NOTE — Plan of Care (Signed)

## 2020-01-15 NOTE — H&P (Signed)
TRH H&P    Patient Demographics:    Donald Duran, is a 69 y.o. male  MRN: [id number]  DOB - 1951/03/17  Admit Date - 01/15/2020  Referring MD/NP/PA:  Rolland Donald  Outpatient Primary MD for the patient is Wyatt Haste, NP  Patient coming from:  home  Chief complaint- cellulitis   HPI:    Donald Duran  is a 69 y.o. male,  w hypertension, Dm2, PVD, h/o L BKA, presents with c/o cellulitiis > 3 weeks.  Pt has been taking keflex for the past 3 weeks and doesn't seem to be improving and therefore presented to ER for evaluation.   In ED,  T 98.5, P 88 R 16, Bp 125/83  Pox 100% on ra Wt 83.5kg  Xray R foot IMPRESSION: 1. Findings suspicious for osteomyelitis of the fifth toe distal tuft and fourth toe middle phalanx and proximal interphalangeal joint. Associated soft tissue defect about the distal fifth digit. 2. Soft tissue ulcer about the first digit but no radiographic evidence of osteomyelitis at this site.  Wbc 9.9, Hgb 13.0, Plt 261 ESR 50 Crp 1.3  Na 136, K 4.0 Bun 31, creatinine 1.00 Glucose 241  covid -19 pcr negative  Pt will be admitted for cellulitis r/o osteomyelitis     Review of systems:    In addition to the HPI above,  No Fever-chills, No Headache, No changes with Vision or hearing, No problems swallowing food or Liquids, No Chest pain, Cough or Shortness of Breath, No Abdominal pain, No Nausea or Vomiting, bowel movements are regular, No Blood in stool or Urine, No dysuria,   No new joints pains-aches,  No new weakness, tingling, numbness in any extremity, No recent weight gain or loss, No polyuria, polydypsia or polyphagia, No significant Mental Stressors.  All other systems reviewed and are negative.    Past History of the following :    Past Medical History:  Diagnosis Date  . Diabetes mellitus without complication ([place])   . Hypertension   .  Osteomyelitis ([place])   . Peripheral vascular disease [facility])       Past Surgical History:  Procedure Laterality Date  . LEG AMPUTATION BELOW KNEE Left 2017   Done at [facility] in [place] by Dr. Shara Blazing  . OPEN TREATMENT ORBITAL FLOOR [place]  . TOE AMPUTATION Left   . TONSILLECTOMY        Social History:      Social History   Tobacco Use  . Smoking status: Passive Smoke Exposure - Never Smoker  . Smokeless tobacco: Never Used  Substance Use Topics  . Alcohol use: No       Family History :     Family History  Problem Relation Age of Onset  . Diabetes Mother   . Cancer Father        Home Medications:   Prior to Admission medications   Medication Sig Start Date End Date Taking? Authorizing Provider  aspirin EC 81 MG tablet Take 81 mg by mouth  daily.    [provider]  atorvastatin (LIPITOR) 20 MG tablet Take 20 mg by mouth daily.    [provider]  glucose blood test strip Use as instructed 10/01/17   Soyla Dryer, PA-C  Insulin Glargine (BASAGLAR KWIKPEN [place]) Inject 40 Units into the skin at bedtime.     [provider]  lisinopril (ZESTRIL) 20 MG tablet Take 20 mg by mouth daily. 06/18/19   [provider]  metFORMIN (GLUCOPHAGE) 500 MG tablet Take 500 mg by mouth 2 (two) times daily with a meal.    [provider]  ONE TOUCH LANCETS MISC Check bs as directed 10/01/17   Soyla Dryer, PA-C     Allergies:    No Known Allergies   Physical Exam:   Vitals  Blood pressure 126/70, pulse 77, temperature 98.5 F (36.9 C), temperature source Oral, resp. rate 16, height '5\' 11"'  (1.803 m), weight 83 kg, SpO2 94 %.  1.  General: axoxo3  2. Psychiatric: euthymic  3. Neurologic: nonfocal  4. HEENMT:  Anicteric, pupils 1.81m symmetric, direct, consensual intact Neck: no jvd  5. Respiratory : CTAB  6. Cardiovascular : rrr s1, s2, no m/g/r  7. Gastrointestinal:  Abd: soft, nt,  nd, +bs  8. Skin:  Ext: no c/c/e,  Redness of the dorsum of right foot , skin ulcer 1.25cm on the tip of the 1st digit, and also under the 5th digit there is ? Ulcer curved  L BKA  9.Musculoskeletal:  Good ROM    Data Review:    CBC Recent Labs  Lab 01/15/20 0038  WBC 9.9  HGB 13.0  HCT 40.2  PLT 261  MCV 94.4  MCH 30.5  MCHC 32.3  RDW 12.3  LYMPHSABS 2.6  MONOABS 0.9  EOSABS 0.4  BASOSABS 0.1   ------------------------------------------------------------------------------------------------------------------  Results for orders placed or performed during the hospital encounter of 01/15/20 (from the past 48 hour(s))  CBG monitoring, ED     Status: Abnormal   Collection Time: 01/14/20  6:17 PM  Result Value Ref Range   Glucose-Capillary 122 (H) 70 - 99 mg/dL  CBC with Differential     Status: None   Collection Time: 01/15/20 12:38 AM  Result Value Ref Range   WBC 9.9 4.0 - 10.5 K/uL   RBC 4.26 4.22 - 5.81 MIL/uL   Hemoglobin 13.0 13.0 - 17.0 g/dL   HCT 40.2 39.0 - 52.0 %   MCV 94.4 80.0 - 100.0 fL   MCH 30.5 26.0 - 34.0 pg   MCHC 32.3 30.0 - 36.0 g/dL   RDW 12.3 11.5 - 15.5 %   Platelets 261 150 - 400 K/uL   nRBC 0.0 0.0 - 0.2 %   Neutrophils Relative % 60 %   Neutro Abs 6.0 1.7 - 7.7 K/uL   Lymphocytes Relative 26 %   Lymphs Abs 2.6 0.7 - 4.0 K/uL   Monocytes Relative 9 %   Monocytes Absolute 0.9 0.1 - 1.0 K/uL   Eosinophils Relative 4 %   Eosinophils Absolute 0.4 0.0 - 0.5 K/uL   Basophils Relative 1 %   Basophils Absolute 0.1 0.0 - 0.1 K/uL   Immature Granulocytes 0 %   Abs Immature Granulocytes 0.03 0.00 - 0.07 K/uL    Comment: Performed at [facility] [address] [place] [id number] Basic metabolic panel     Status: Abnormal   Collection Time: 01/15/20 12:38 AM  Result Value Ref Range   Sodium 136 135 -  145 mmol/L   Potassium 4.0 3.5 - 5.1 mmol/L   Chloride 101 98 - 111 mmol/L   CO2 25 22 - 32 mmol/L   Glucose, Bld 241 (H) 70 - 99  mg/dL   BUN 31 (H) 8 - 23 mg/dL   Creatinine, Ser 1.00 0.61 - 1.24 mg/dL   Calcium 9.1 8.9 - 10.3 mg/dL   GFR calc non Af Amer >60 >60 mL/min   GFR calc Af Amer >60 >60 mL/min   Anion gap 10 5 - 15    Comment: Performed at [facility], [address], [place], [place] [address]  Sedimentation rate     Status: Abnormal   Collection Time: 01/15/20 12:39 AM  Result Value Ref Range   Sed Rate 50 (H) 0 - 16 mm/hr    Comment: Performed at [facility], [address], [place], [place] [address]  C-reactive protein     Status: Abnormal   Collection Time: 01/15/20 12:39 AM  Result Value Ref Range   CRP 1.3 (H) <1.0 mg/dL    Comment: Performed at [facility], [address], [place], [place] [address]  Respiratory Panel by RT PCR (Flu A&B, Covid) - Nasopharyngeal Swab     Status: None   Collection Time: 01/15/20  1:20 AM   Specimen: Nasopharyngeal Swab  Result Value Ref Range   SARS Coronavirus 2 by RT PCR NEGATIVE NEGATIVE    Comment: (NOTE) SARS-CoV-2 target nucleic acids are NOT DETECTED. The SARS-CoV-2 RNA is generally detectable in upper respiratoy specimens during the acute phase of infection. The lowest concentration of SARS-CoV-2 viral copies this assay can detect is 131 copies/mL. A negative result does not preclude SARS-Cov-2 infection and should not be used as the sole basis for treatment or other patient management decisions. A negative result may occur with  improper specimen collection/handling, submission of specimen other than nasopharyngeal swab, presence of viral mutation(s) within the areas targeted by this assay, and inadequate number of viral copies (<131 copies/mL). A negative result must be combined with clinical observations, patient history, and epidemiological information. The expected result is Negative. Fact Sheet for Patients:  PinkCheek.be Fact Sheet for Healthcare Providers:  GravelBags.it This test  is not yet ap proved or cleared by the [place] FDA and  has been authorized for detection and/or diagnosis of SARS-CoV-2 by FDA under an Emergency Use Authorization (EUA). This EUA will remain  in effect (meaning this test can be used) for the duration of the COVID-19 declaration under Section 564(b)(1) of the Act, 21 U.S.C. section 360bbb-3(b)(1), unless the authorization is terminated or revoked sooner.    Influenza A by PCR NEGATIVE NEGATIVE   Influenza B by PCR NEGATIVE NEGATIVE    Comment: (NOTE) The Xpert Xpress SARS-CoV-2/FLU/RSV assay is intended as an aid in  the diagnosis of influenza from Nasopharyngeal swab specimens and  should not be used as a sole basis for treatment. Nasal washings and  aspirates are unacceptable for Xpert Xpress SARS-CoV-2/FLU/RSV  testing. Fact Sheet for Patients: PinkCheek.be Fact Sheet for Healthcare Providers: GravelBags.it This test is not yet approved or cleared by the [place] FDA and  has been authorized for detection and/or diagnosis of SARS-CoV-2 by  FDA under an Emergency Use Authorization (EUA). This EUA will remain  in effect (meaning this test can be used) for the duration of the  Covid-19 declaration under Section 564(b)(1) of the Act, 21  U.S.C. section 360bbb-3(b)(1), unless the authorization is  terminated or revoked. Performed at [facility], [address], [place], [place] [address]     Chemistries  Recent Labs  Lab 01/15/20 0038  NA 136  K 4.0  CL 101  CO2 25  GLUCOSE 241*  BUN 31*  CREATININE 1.00  CALCIUM 9.1   ------------------------------------------------------------------------------------------------------------------  ------------------------------------------------------------------------------------------------------------------ GFR: Estimated Creatinine Clearance: 77.4 mL/min (by C-G formula based on SCr of 1 mg/dL). Liver Function  Tests: No results for input(s): AST, ALT, ALKPHOS, BILITOT, PROT, ALBUMIN in the last 168 hours. No results for input(s): LIPASE, AMYLASE in the last 168 hours. No results for input(s): AMMONIA in the last 168 hours. Coagulation Profile: No results for input(s): INR, PROTIME in the last 168 hours. Cardiac Enzymes: No results for input(s): CKTOTAL, CKMB, CKMBINDEX, TROPONINI in the last 168 hours. BNP (last 3 results) No results for input(s): PROBNP in the last 8760 hours. HbA1C: No results for input(s): HGBA1C in the last 72 hours. CBG: Recent Labs  Lab 01/14/20 1817  GLUCAP 122*   Lipid Profile: No results for input(s): CHOL, HDL, LDLCALC, TRIG, CHOLHDL, LDLDIRECT in the last 72 hours. Thyroid Function Tests: No results for input(s): TSH, T4TOTAL, FREET4, T3FREE, THYROIDAB in the last 72 hours. Anemia Panel: No results for input(s): VITAMINB12, FOLATE, FERRITIN, TIBC, IRON, RETICCTPCT in the last 72 hours.  --------------------------------------------------------------------------------------------------------------- Urine analysis: No results found for: COLORURINE, APPEARANCEUR, LABSPEC, PHURINE, GLUCOSEU, HGBUR, BILIRUBINUR, KETONESUR, PROTEINUR, UROBILINOGEN, NITRITE, LEUKOCYTESUR    Imaging Results:    DG Foot Complete Right  Result Date: 01/14/2020 CLINICAL DATA:  Foot wound. Right foot swelling and redness. Wound about the right great toe. Skin breakdown about the fifth toe. History of diabetes. EXAM: RIGHT FOOT COMPLETE - 3+ VIEW COMPARISON:  Slight radiograph 11/27/2019 FINDINGS: Soft tissue defect about the plantar aspect of the great toe. No associated periosteal reaction or abnormal bone density. Skin irregularity with soft tissue air about the distal fifth toe, erosion noted of the fifth toe distal phalanx suspicious for osteomyelitis. Fourth digit partially obscured by overlap from the fifth toe, lucency through the fourth toe middle phalanx and decreased density of  the fourth toe distal phalanx suspicious for osteomyelitis and septic joint. No radiopaque foreign body. Scattered osteoarthritis throughout the foot. Advanced vascular calcifications. IMPRESSION: 1. Findings suspicious for osteomyelitis of the fifth toe distal tuft and fourth toe middle phalanx and proximal interphalangeal joint. Associated soft tissue defect about the distal fifth digit. 2. Soft tissue ulcer about the first digit but no radiographic evidence of osteomyelitis at this site. Electronically Signed   By: Keith Rake M.D.   On: 01/14/2020 19:57       Assessment & Plan:    Principal Problem:   Cellulitis Active Problems:   Type II diabetes mellitus, uncontrolled ([place])   Essential hypertension, benign   Mixed hyperlipidemia  Cellulitis r/o osteomyelitis MRI ordered  Vanco iv zosyn iv pharmacy to dose If pt has osteomyelitis please consult orthopedics  Hypertension Cont Lisinopril 5m po qday  Hyperlipidemia  Cont Atorvastatin 277mpo qhs  Dm2 Cont Metformin  fsbs ac and qhs, ISS  PVD Cont aspirin, Cont Atorvastatin as above  DVT Prophylaxis-   Lovenox - SCDs   AM Labs Ordered, also please review Full Orders  Family Communication: Admission, patients condition and plan of care including tests being ordered have been discussed with the patient  who indicate understanding and agree with the plan and Code Status.  Code Status:  FULL CODE per patient, [facility]  Admission status: Inpatient: Based on patients clinical presentation and evaluation of above clinical data, I have made determination that patient meets Inpatient criteria at this time.    Time spent in minutes : 55 minutes   Jani Gravel M.D on 01/15/2020 at 6:11 AM

## 2020-01-15 NOTE — ED Notes (Signed)
Waiting for Lantus from pharmacy

## 2020-01-15 NOTE — ED Notes (Signed)
Pt was given breakfast tray 

## 2020-01-16 ENCOUNTER — Inpatient Hospital Stay (HOSPITAL_COMMUNITY): Payer: Medicare Other

## 2020-01-16 DIAGNOSIS — I739 Peripheral vascular disease, unspecified: Secondary | ICD-10-CM

## 2020-01-16 DIAGNOSIS — I96 Gangrene, not elsewhere classified: Secondary | ICD-10-CM

## 2020-01-16 DIAGNOSIS — L0390 Cellulitis, unspecified: Secondary | ICD-10-CM

## 2020-01-16 LAB — CBC
HCT: 38.5 % — ABNORMAL LOW (ref 39.0–52.0)
Hemoglobin: 13 g/dL (ref 13.0–17.0)
MCH: 30.9 pg (ref 26.0–34.0)
MCHC: 33.8 g/dL (ref 30.0–36.0)
MCV: 91.4 fL (ref 80.0–100.0)
Platelets: 220 10*3/uL (ref 150–400)
RBC: 4.21 MIL/uL — ABNORMAL LOW (ref 4.22–5.81)
RDW: 12.2 % (ref 11.5–15.5)
WBC: 9.1 10*3/uL (ref 4.0–10.5)
nRBC: 0 % (ref 0.0–0.2)

## 2020-01-16 LAB — SARS CORONAVIRUS 2 (TAT 6-24 HRS): SARS Coronavirus 2: NEGATIVE

## 2020-01-16 LAB — COMPREHENSIVE METABOLIC PANEL
ALT: 18 U/L (ref 0–44)
AST: 19 U/L (ref 15–41)
Albumin: 3.2 g/dL — ABNORMAL LOW (ref 3.5–5.0)
Alkaline Phosphatase: 49 U/L (ref 38–126)
Anion gap: 9 (ref 5–15)
BUN: 15 mg/dL (ref 8–23)
CO2: 28 mmol/L (ref 22–32)
Calcium: 9 mg/dL (ref 8.9–10.3)
Chloride: 104 mmol/L (ref 98–111)
Creatinine, Ser: 0.97 mg/dL (ref 0.61–1.24)
GFR calc Af Amer: >60 mL/min (ref >60)
GFR calc non Af Amer: >60 mL/min (ref >60)
Glucose, Bld: 130 mg/dL — ABNORMAL HIGH (ref 70–99)
Potassium: 4.3 mmol/L (ref 3.5–5.1)
Sodium: 141 mmol/L (ref 135–145)
Total Bilirubin: 1 mg/dL (ref 0.3–1.2)
Total Protein: 6.5 g/dL (ref 6.5–8.1)

## 2020-01-16 LAB — GLUCOSE, CAPILLARY
Glucose-Capillary: 132 mg/dL — ABNORMAL HIGH (ref 70–99)
Glucose-Capillary: 151 mg/dL — ABNORMAL HIGH (ref 70–99)
Glucose-Capillary: 122 mg/dL — ABNORMAL HIGH (ref 70–99)
Glucose-Capillary: 177 mg/dL — ABNORMAL HIGH (ref 70–99)

## 2020-01-16 LAB — HIV ANTIBODY (ROUTINE TESTING W REFLEX): HIV Screen 4th Generation wRfx: NONREACTIVE

## 2020-01-16 MED ORDER — GADOBUTROL 1 MMOL/ML IV SOLN
8.0000 mL | Freq: Once | INTRAVENOUS | Status: AC | PRN
  Administered 2020-01-16: 8 mL via INTRAVENOUS

## 2020-01-16 NOTE — Progress Notes (Signed)
[facility]  Subjective  - No complaints.  Resting comfortably.  Objective 120/74 78 98.6 F (37 C) (Oral) 18 97%  Intake/Output Summary (Last 24 hours) at 01/16/2020 0957 Last data filed at 01/15/2020 1700 Gross per 24 hour  Intake 723 ml  Output -  Net 723 ml    L BKA well healed Palpable femoral pulses bilaterally Right DP weakly palpable  Laboratory Lab Results: Recent Labs    01/15/20 0038 01/16/20 0636  WBC 9.9 9.1  HGB 13.0 13.0  HCT 40.2 38.5*  PLT 261 220   BMET Recent Labs    01/15/20 0038 01/16/20 0636  NA 136 141  K 4.0 4.3  CL 101 104  CO2 25 28  GLUCOSE 241* 130*  BUN 31* 15  CREATININE 1.00 0.97  CALCIUM 9.1 9.0    COAG No results found for: INR, PROTIME No results found for: PTT  Assessment/Planning:  69 year old male with diabetes presents with right lower extremity tissue loss.  Plan for ABIs today which has been ordered.  Can continue broad-spectrum antibiotics.  Tentative plan for aortogram with right lower extremity arteriogram tomorrow with Dr. Randie Heinz and has been added on to the PV schedule.  Risk benefits discussed.  Please have n.p.o. after midnight.  Discussed some risk of moving his case to Tuesday if schedule gets behind tomorrow.  Cephus Shelling 01/16/2020 9:57 AM --

## 2020-01-16 NOTE — Progress Notes (Signed)
VASCULAR LAB PRELIMINARY  PRELIMINARY  PRELIMINARY  PRELIMINARY  ABIs completed.    Preliminary report:  See CV proc for preliminary results.   Nikash Mortensen, RVT 01/16/2020, 6:05 PM

## 2020-01-16 NOTE — Plan of Care (Signed)

## 2020-01-16 NOTE — Progress Notes (Signed)
PROGRESS NOTE  Donald Duran RXV:[id number] DOB: 01-27-1951 DOA: 01/15/2020 PCP: Wyatt Haste, NP   LOS: 1 day   Brief narrative: As per HPI, 69 y/o male with a history of [name] in 2017, peripheral vascular disease presenting with erythema, drainage, and pain on his right foot particularly his fourth toe.  The patient states that he was seen in the ED on 11/27/2019 for his diabetic foot infection involving the right fourth toe at that time.  He was discharged home with cephalexin.  Apparently it had gradually improved but not completely resolved in terms of this ulceration or erythema.  He was placed on another course of cephalexin by his PCP.  Once again there was some mild improvement while he was on antibiotics.  He finished antibiotics approximately 10 days prior to this admission.  Since then, he has noted increasing erythema, drainage, and foul odor.  As result he presented for further evaluation.  The patient denies any fevers, chills, headache, chest pain, shortness breath, nausea, vomit, diarrhea, abdominal pain.  There is no dysuria or hematuria.  He denies any new medications. In the emergency department, the patient was afebrile hemodynamically stable with oxygen saturation 98% room air.  BMP and CBC were essentially unremarkable with WBC 9.9.  X-ray of the right foot was suspicious for osteomyelitis of the distal fifth toe and fourth middle phalanx.  ESR 50, CRP 1.3.  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 Had seen Dr. Trula Slade in the past on 10/02/2016, but he was lost to follow-up and never followed through with ABIs. ABI is pending. on  vanco and zosyn.  MRI of the foot  performed today showed osteomyelitis of the proximal middle and distal phalanx of the right fourth digit with mildly displaced fracture and PIP joint septic arthritis.  Vascular surgery is also planning for aortogram with right lower extremity arteriogram tomorrow.  Essential hypertension Blood pressure seems to be stable.  On lisinopril  Uncontrolled diabetes mellitus type 2 with hyperglycemia -Hemoglobin A1c of 7.0.  On low-dose Lantus, sliding scale insulin.  Hold Metformin.  Diabetic diet.  Closely monitor.  Peripheral vascular disease -Vascular surgery on board. Continue aspirin  Hyperlipidemia -Continue statin  VTE Prophylaxis: subq lovenox   Code Status:  Full code   Family Communication: None  Disposition Plan: Likely home with home health.  Vascular surgery on board.  Consultants:  Vascular surgery  Procedures: None so far  Antibiotics:  Vancomycin and Zosyn  Anti-infectives (From admission, onward)   Start     Dose/Rate Route Frequency Ordered Stop   01/15/20 1800  vancomycin (VANCOCIN) IVPB 1000 mg/200 mL premix     1,000 mg 200 mL/hr over 60 Minutes Intravenous Every 12 hours 01/15/20 0348     01/15/20 1400  piperacillin-tazobactam (ZOSYN) IVPB 3.375 g     3.375 g 12.5 mL/hr over 240 Minutes Intravenous Every 8 hours 01/15/20 0652     01/15/20 0400  vancomycin (VANCOCIN) IVPB 1000 mg/200 mL premix     1,000 mg 200 mL/hr over 60 Minutes Intravenous Every 1 hr x 2 01/15/20 0302 01/15/20 0735   01/15/20 0300  piperacillin-tazobactam (ZOSYN) IVPB 3.375 g     3.375 g 100 mL/hr over 30  Minutes Intravenous  Once 01/15/20 0249 01/15/20 0418      Subjective: Today, denies overt pain in the leg.  No fever or chills.  No nausea vomiting or shortness of breath.  Objective: Vitals:   01/16/20 0358 01/16/20 0758  BP: 118/84 120/74  Pulse: 77 78  Resp:  18  Temp: 98.6 F (37 C) 98.6 F (37 C)  SpO2: 96% 97%    Intake/Output Summary (Last 24 hours) at  01/16/2020 0812 Last data filed at 01/15/2020 1700 Gross per 24 hour  Intake 723 ml  Output --  Net 723 ml   Filed Weights   01/14/20 1813 01/14/20 1817 01/16/20 0500  Weight: 83.5 kg 83 kg 80.5 kg   Body mass index is 24.75 kg/m.   Physical Exam: GENERAL: Patient is alert awake and oriented. Not in obvious distress. HENT: No scleral pallor or icterus. Pupils equally reactive to light. Oral mucosa is moist NECK: is supple, no palpable thyroid enlargement. CHEST: Clear to auscultation. No crackles or wheezes. Non tender on palpation. Diminished breath sounds bilaterally. CVS: S1 and S2 heard, no murmur. Regular rate and rhythm. No pericardial rub. ABDOMEN: Soft, non-tender, bowel sounds are present. EXTREMITIES: Left below-knee amputation well-healed.. Right foot-with palpable dorsalis pedis, fourth toe erythematous tender.  Dorsal foot erythematous.  Ulcer of the great toe noted.  Scabs over the right lower leg. CNS: Cranial nerves are intact. No focal motor or sensory deficits. SKIN: warm and dry , right foot erythematous with ulceration.  Data Review: I have personally reviewed the following laboratory data and studies,  CBC: Recent Labs  Lab 01/15/20 0038 01/16/20 0636  WBC 9.9 9.1  NEUTROABS 6.0  --   HGB 13.0 13.0  HCT 40.2 38.5*  MCV 94.4 91.4  PLT 261 016   Basic Metabolic Panel: Recent Labs  Lab 01/15/20 0038 01/16/20 0636  NA 136 141  K 4.0 4.3  CL 101 104  CO2 25 28  GLUCOSE 241* 130*  BUN 31* 15  CREATININE 1.00 0.97  CALCIUM 9.1 9.0   Liver Function Tests: Recent Labs  Lab 01/16/20 0636  AST 19  ALT 18  ALKPHOS 49  BILITOT 1.0  PROT 6.5  ALBUMIN 3.2*   No results for input(s): LIPASE, AMYLASE in the last 168 hours. No results for input(s): AMMONIA in the last 168 hours. Cardiac Enzymes: No results for input(s): CKTOTAL, CKMB, CKMBINDEX, TROPONINI in the last 168 hours. BNP (last 3 results) No results for input(s): BNP in the last 8760  hours.  ProBNP (last 3 results) No results for input(s): PROBNP in the last 8760 hours.  CBG: Recent Labs  Lab 01/15/20 0821 01/15/20 1200 01/15/20 1615 01/15/20 2053 01/16/20 0810  GLUCAP 245* 170* 161* 155* 132*   Recent Results (from the past 240 hour(s))  Respiratory Panel by RT PCR (Flu A&B, Covid) - Nasopharyngeal Swab     Status: None   Collection Time: 01/15/20  1:20 AM   Specimen: Nasopharyngeal Swab  Result Value Ref Range Status   SARS Coronavirus 2 by RT PCR NEGATIVE NEGATIVE Final    Comment: (NOTE) SARS-CoV-2 target nucleic acids are NOT DETECTED. The SARS-CoV-2 RNA is generally detectable in upper respiratoy specimens during the acute phase of infection. The lowest concentration of SARS-CoV-2 viral copies this assay can detect is 131 copies/mL. A negative result does not preclude SARS-Cov-2 infection and should not be used as the sole basis for treatment or other patient management decisions. A negative result may occur with  improper specimen collection/handling, submission of specimen other than nasopharyngeal swab, presence of viral mutation(s) within the areas targeted by this assay, and inadequate number of viral copies (<131 copies/mL). A negative result must be combined with clinical observations, patient history, and epidemiological information. The expected result is Negative. Fact Sheet for Patients:  PinkCheek.be Fact Sheet for Healthcare Providers:  GravelBags.it This test is not yet ap proved or cleared by the [place] FDA and  has been authorized for detection and/or diagnosis of SARS-CoV-2 by FDA under an Emergency Use Authorization (EUA). This EUA will remain  in effect (meaning this test can be used) for the duration of the COVID-19 declaration under Section 564(b)(1) of the Act, 21 U.S.C. section 360bbb-3(b)(1), unless the authorization is terminated or revoked sooner.     Influenza A by PCR NEGATIVE NEGATIVE Final   Influenza B by PCR NEGATIVE NEGATIVE Final    Comment: (NOTE) The Xpert Xpress SARS-CoV-2/FLU/RSV assay is intended as an aid in  the diagnosis of influenza from Nasopharyngeal swab specimens and  should not be used as a sole basis for treatment. Nasal washings and  aspirates are unacceptable for Xpert Xpress SARS-CoV-2/FLU/RSV  testing. Fact Sheet for Patients: PinkCheek.be Fact Sheet for Healthcare Providers: GravelBags.it This test is not yet approved or cleared by the [place] FDA and  has been authorized for detection and/or diagnosis of SARS-CoV-2 by  FDA under an Emergency Use Authorization (EUA). This EUA will remain  in effect (meaning this test can be used) for the duration of the  Covid-19 declaration under Section 564(b)(1) of the Act, 21  U.S.C. section 360bbb-3(b)(1), unless the authorization is  terminated or revoked. Performed at [facility], [address], [place], [place] [address]      Studies: DG Foot Complete Right  Result Date: 01/14/2020 CLINICAL DATA:  Foot wound. Right foot swelling and redness. Wound about the right great toe. Skin breakdown about the fifth toe. History of diabetes. EXAM: RIGHT FOOT COMPLETE - 3+ VIEW COMPARISON:  Slight radiograph 11/27/2019 FINDINGS: Soft tissue defect about the plantar aspect of the great toe. No associated periosteal reaction or abnormal bone density. Skin irregularity with soft tissue air about the distal fifth toe, erosion noted of the fifth toe distal phalanx suspicious for osteomyelitis. Fourth digit partially obscured by overlap from the fifth toe, lucency through the fourth toe middle phalanx and decreased density of the fourth toe distal phalanx suspicious for osteomyelitis and septic joint. No radiopaque foreign body. Scattered osteoarthritis throughout the foot. Advanced vascular calcifications. IMPRESSION: 1.  Findings suspicious for osteomyelitis of the fifth toe distal tuft and fourth toe middle phalanx and proximal interphalangeal joint. Associated soft tissue defect about the distal fifth digit. 2. Soft tissue ulcer about the first digit but no radiographic evidence of osteomyelitis at this site. Electronically Signed   By: Keith Rake M.D.   On: 01/14/2020 19:57    Scheduled Meds: . aspirin EC  81 mg Oral Daily  . atorvastatin  20 mg Oral Daily  . enoxaparin (LOVENOX) injection  40 mg Subcutaneous Q24H  . insulin aspart  0-15 Units Subcutaneous TID WC  . insulin aspart  0-5 Units Subcutaneous QHS  . insulin glargine  15 Units Subcutaneous Daily  . lisinopril  20 mg Oral Daily  . sodium chloride flush  3 mL Intravenous Q12H    Continuous Infusions: . sodium chloride    . piperacillin-tazobactam (ZOSYN)  IV 3.375 g (01/16/20 0528)  . vancomycin 1,000 mg (01/16/20 0618)  Flora Lipps, MD  [facility] 01/16/2020

## 2020-01-17 ENCOUNTER — Encounter (HOSPITAL_COMMUNITY)
Admission: EM | Disposition: A | Discharge status: Home Health | Payer: Self-pay | Source: Home / Self Care | Attending: Internal Medicine

## 2020-01-17 HISTORY — PX: ABDOMINAL AORTOGRAM W/LOWER EXTREMITY: CATH118223

## 2020-01-17 LAB — PROTIME-INR
INR: 1 (ref 0.8–1.2)
Prothrombin Time: 13.4 seconds (ref 11.4–15.2)

## 2020-01-17 LAB — CBC
HCT: 39.2 % (ref 39.0–52.0)
Hemoglobin: 13.3 g/dL (ref 13.0–17.0)
MCH: 30.6 pg (ref 26.0–34.0)
MCHC: 33.9 g/dL (ref 30.0–36.0)
MCV: 90.3 fL (ref 80.0–100.0)
Platelets: 231 10*3/uL (ref 150–400)
RBC: 4.34 MIL/uL (ref 4.22–5.81)
RDW: 11.9 % (ref 11.5–15.5)
WBC: 7.9 10*3/uL (ref 4.0–10.5)
nRBC: 0 % (ref 0.0–0.2)

## 2020-01-17 LAB — GLUCOSE, CAPILLARY
Glucose-Capillary: 173 mg/dL — ABNORMAL HIGH (ref 70–99)
Glucose-Capillary: 104 mg/dL — ABNORMAL HIGH (ref 70–99)
Glucose-Capillary: 93 mg/dL (ref 70–99)
Glucose-Capillary: 189 mg/dL — ABNORMAL HIGH (ref 70–99)
Glucose-Capillary: 269 mg/dL — ABNORMAL HIGH (ref 70–99)

## 2020-01-17 LAB — BASIC METABOLIC PANEL
Anion gap: 11 (ref 5–15)
BUN: 17 mg/dL (ref 8–23)
CO2: 26 mmol/L (ref 22–32)
Calcium: 9.2 mg/dL (ref 8.9–10.3)
Chloride: 103 mmol/L (ref 98–111)
Creatinine, Ser: 0.98 mg/dL (ref 0.61–1.24)
GFR calc Af Amer: >60 mL/min (ref >60)
GFR calc non Af Amer: >60 mL/min (ref >60)
Glucose, Bld: 134 mg/dL — ABNORMAL HIGH (ref 70–99)
Potassium: 4.4 mmol/L (ref 3.5–5.1)
Sodium: 140 mmol/L (ref 135–145)

## 2020-01-17 SURGERY — ABDOMINAL AORTOGRAM W/LOWER EXTREMITY
Anesthesia: LOCAL | Laterality: Right

## 2020-01-17 MED ORDER — MIDAZOLAM HCL 2 MG/2ML IJ SOLN
INTRAMUSCULAR | Status: AC
  Filled 2020-01-17: qty 2

## 2020-01-17 MED ORDER — SODIUM CHLORIDE 0.9 % IV SOLN: INTRAVENOUS | Status: DC

## 2020-01-17 MED ORDER — HYDRALAZINE HCL 20 MG/ML IJ SOLN: 5.0000 mg | INTRAMUSCULAR | Status: DC | PRN

## 2020-01-17 MED ORDER — HEPARIN (PORCINE) IN NACL 1000-0.9 UT/500ML-% IV SOLN
INTRAVENOUS | Status: AC
  Filled 2020-01-17: qty 1000

## 2020-01-17 MED ORDER — MIDAZOLAM HCL 2 MG/2ML IJ SOLN
INTRAMUSCULAR | Status: DC | PRN
  Administered 2020-01-17 (×2): 1 mg via INTRAVENOUS

## 2020-01-17 MED ORDER — SODIUM CHLORIDE 0.9 % IV SOLN: 250.0000 mL | INTRAVENOUS | Status: DC | PRN

## 2020-01-17 MED ORDER — ONDANSETRON HCL 4 MG/2ML IJ SOLN: 4.0000 mg | Freq: Four times a day (QID) | INTRAMUSCULAR | Status: DC | PRN

## 2020-01-17 MED ORDER — LABETALOL HCL 5 MG/ML IV SOLN: 10.0000 mg | INTRAVENOUS | Status: DC | PRN

## 2020-01-17 MED ORDER — FENTANYL CITRATE (PF) 100 MCG/2ML IJ SOLN
INTRAMUSCULAR | Status: DC | PRN
  Administered 2020-01-17: 50 ug via INTRAVENOUS

## 2020-01-17 MED ORDER — HEPARIN (PORCINE) IN NACL 1000-0.9 UT/500ML-% IV SOLN
INTRAVENOUS | Status: DC | PRN
  Administered 2020-01-17 (×2): 500 mL

## 2020-01-17 MED ORDER — SODIUM CHLORIDE 0.9% FLUSH
3.0000 mL | Freq: Two times a day (BID) | INTRAVENOUS | Status: DC
  Administered 2020-01-18 – 2020-01-19 (×2): 3 mL via INTRAVENOUS

## 2020-01-17 MED ORDER — SODIUM CHLORIDE 0.9% FLUSH: 3.0000 mL | INTRAVENOUS | Status: DC | PRN

## 2020-01-17 MED ORDER — FENTANYL CITRATE (PF) 100 MCG/2ML IJ SOLN
INTRAMUSCULAR | Status: AC
  Filled 2020-01-17: qty 2

## 2020-01-17 MED ORDER — LIDOCAINE HCL (PF) 1 % IJ SOLN
INTRAMUSCULAR | Status: DC | PRN
  Administered 2020-01-17: 17 mL

## 2020-01-17 MED ORDER — ACETAMINOPHEN 325 MG PO TABS: 650.0000 mg | ORAL_TABLET | ORAL | Status: DC | PRN

## 2020-01-17 MED ORDER — IODIXANOL 320 MG/ML IV SOLN
INTRAVENOUS | Status: DC | PRN
  Administered 2020-01-17: 16:00:00 70 mL

## 2020-01-17 MED ORDER — SODIUM CHLORIDE 0.9 % WEIGHT BASED INFUSION: 1.0000 mL/kg/h | INTRAVENOUS | Status: AC

## 2020-01-17 MED ORDER — LIDOCAINE HCL (PF) 1 % IJ SOLN
INTRAMUSCULAR | Status: AC
  Filled 2020-01-17: qty 30

## 2020-01-17 SURGICAL SUPPLY — 11 items
CATH OMNI FLUSH 5F 65CM (CATHETERS) ×2 IMPLANT
CLOSURE MYNX CONTROL 5F (Vascular Products) ×2 IMPLANT
GUIDEWIRE ANGLED .035X150CM (WIRE) ×2 IMPLANT
KIT MICROPUNCTURE NIT STIFF (SHEATH) ×2 IMPLANT
KIT PV (KITS) ×2 IMPLANT
SHEATH PINNACLE 5F 10CM (SHEATH) ×2 IMPLANT
SHEATH PROBE COVER 6X72 (BAG) ×2 IMPLANT
SYR MEDRAD MARK V 150ML (SYRINGE) ×2 IMPLANT
TRANSDUCER W/STOPCOCK (MISCELLANEOUS) ×2 IMPLANT
TRAY PV CATH (CUSTOM PROCEDURE TRAY) ×2 IMPLANT
WIRE BENTSON .035X145CM (WIRE) ×2 IMPLANT

## 2020-01-17 NOTE — Op Note (Signed)
    Patient name: Donald Duran MRN: [id number] DOB: 26-Feb-1951 Sex: male  01/17/2020 Pre-operative Diagnosis: Right lower extremity tissue loss Post-operative diagnosis:  Same Surgeon:  Cephus Shelling, MD Procedure Performed: 1.  Ultrasound-guided access of the left common femoral artery 2.  Aortogram 3.  Bilateral lower extremity arteriogram with runoff  4.  27 minutes of monitored moderate conscious sedation 5.  Mynx closure of the left common femoral artery  Indications: 69 year old male with history of left below-knee amputation as well as diabetes that presented over the weekend with tissue loss of his right lower extremity over the last 4 to 6 weeks.  Ultimately he had noncompressible ABIs with abnormal waveforms at the ankle.  He presented today for planned aortogram, lower extremity arteriogram, and possible intervention after risks and benefits were discussed..  Findings:   Aortogram showed no flow-limiting aortoiliac disease.  Right lower extremity arteriogram showed a widely patent common femoral, profunda, SFA, above and below-knee popliteal artery and two-vessel runoff via a widely patent anterior tibial and peroneal arteries.  His posterior tibial was occluded throughout its course.  He had excellent flow into the foot dominantly through the dorsalis pedis with even filling of the digital arteries distally in the toes.  No flow-limiting lesions that require intervention.  He should have adequate inline flow for wound healing in the right foot.    Procedure:  The patient was identified in the holding area and taken to room 8.  The patient was then placed supine on the table and prepped and draped in the usual sterile fashion.  A time out was called.  Ultrasound was used to evaluate the left common femoral artery.  It was patent .  A digital ultrasound image was acquired.  A micropuncture needle was used to access the left common femoral artery under ultrasound guidance.   An 018 wire was advanced without resistance and a micropuncture sheath was placed.  The 018 wire was removed and a benson wire was placed.  The micropuncture sheath was exchanged for a 5 french sheath.  An omniflush catheter was advanced over the wire to the level of L-1.  An abdominal angiogram was obtained.  Given that he had some motion artifact on the initial aortogram, we pulled the catheter down and got a right iliac arteriogram.  Given no flow-limiting lesions in the aortoiliac segment, I then crossed the aortic bifurcation into the right external iliac with a Glidewire and Omni Flush.  We advanced our Omni Flush catheter to the distal external iliac.  Right lower extremity runoff was then obtained.  Pertinent findings are noted above, but ultimately he has inline flow throughout the right lower extremity through the common femoral, SFA, popliteal, and two-vessel runoff via the anterior tibial and peroneal arteries.  There is no flow-limiting lesions that require intervention and he should have adequate flow for wound healing.  At that point in time wires and catheters removed.  Mynx closure device was deployed in the left groin.  Plan: Patient should have adequate inline flow for healing of his right toe wounds.  He will need antibiotics as well as good wound care.  Certainly if his wounds continue to progress we could consider toe amputations if needed.  Cephus Shelling, MD [facility] Office: [phone]  Cephus Shelling

## 2020-01-17 NOTE — Plan of Care (Signed)
  Problem: Education: Goal: Knowledge of General Education information will improve Description: Including pain rating scale, medication(s)/side effects and non-pharmacologic comfort measures Outcome: Progressing   Problem: Health Behavior/Discharge Planning: Goal: Ability to manage health-related needs will improve Outcome: Progressing   Problem: Clinical Measurements: Goal: Will remain free from infection Outcome: Progressing   Problem: Pain Managment: Goal: General experience of comfort will improve Outcome: Progressing   Problem: Safety: Goal: Ability to remain free from injury will improve Outcome: Progressing   Problem: Skin Integrity: Goal: Risk for impaired skin integrity will decrease Outcome: Progressing   Problem: Education: Goal: Knowledge of General Education information will improve Description: Including pain rating scale, medication(s)/side effects and non-pharmacologic comfort measures Outcome: Progressing   Problem: Health Behavior/Discharge Planning: Goal: Ability to manage health-related needs will improve Outcome: Progressing   Problem: Clinical Measurements: Goal: Will remain free from infection Outcome: Progressing   Problem: Pain Managment: Goal: General experience of comfort will improve Outcome: Progressing   Problem: Safety: Goal: Ability to remain free from injury will improve Outcome: Progressing   Problem: Skin Integrity: Goal: Risk for impaired skin integrity will decrease Outcome: Progressing

## 2020-01-17 NOTE — Progress Notes (Signed)
PROGRESS NOTE  Donald Duran XNT:[id number] DOB: Jun 21, 1951 DOA: 01/15/2020 PCP: Wyatt Haste, NP   LOS: 2 days   Brief narrative: As per HPI,  69 y/o male with a history of [name] in 2017, peripheral vascular disease presenting with erythema, drainage, and pain on his right foot particularly his fourth toe.  The patient states that he was seen in the ED on 11/27/2019 for his diabetic foot infection involving the right fourth toe at that time.  He was discharged home with cephalexin.  Apparently it had gradually improved but not completely resolved in terms of this ulceration or erythema.  He was placed on another course of cephalexin by his PCP.  Once again there was some mild improvement while he was on antibiotics.  He finished antibiotics approximately 10 days prior to this admission.  Since then, he has noted increasing erythema, drainage, and foul odor.  As result he presented for further evaluation.  The patient denies any fevers, chills, headache, chest pain, shortness breath, nausea, vomit, diarrhea, abdominal pain.  There is no dysuria or hematuria.  He denies any new medications. In the emergency department, the patient was afebrile hemodynamically stable with oxygen saturation 98% room air.  BMP and CBC were essentially unremarkable with WBC 9.9.  X-ray of the right foot was suspicious for osteomyelitis of the distal fifth toe and fourth middle phalanx.  ESR 50, CRP 1.3.  Assessment/Plan:  Principal Problem:   Cellulitis Active Problems:   Type II diabetes mellitus, uncontrolled (HCC)   Essential hypertension, benign   Mixed hyperlipidemia   Diabetic foot infection ([place])   Acute osteomyelitis of phalanx of right foot (HCC)  Diabetic foot infection/acute osteomyelitis right foot Seen by  vascular surgery. Had seen Dr. Trula Slade in the past on 10/02/2016, but he was lost to follow-up and never followed through with ABIs.  ABI was reported as unreliable.  Continue vanco and  zosyn.  MRI of the foot  showed osteomyelitis of the proximal middle and distal phalanx of the right fourth digit with mildly displaced fracture and PIP joint septic arthritis.  Patient is for abdominal aortogram with lower extremity angiogram today.    Essential hypertension Blood pressure seems to be stable.  On lisinopril.  Creatinine of 0.9  Uncontrolled diabetes mellitus type 2 with hyperglycemia -Hemoglobin A1c of 7.0.  On low-dose Lantus, sliding scale insulin.  Hold Metformin.  Diabetic diet.  Closely monitor.  Last point-of-care glucose was 173.  Peripheral vascular disease -Vascular surgery on board. Continue aspirin  Hyperlipidemia -Continue statin  VTE Prophylaxis: subq lovenox   Code Status:  Full code   Family Communication:  none  Disposition Plan: Likely home with home health.  Vascular surgery on board.  Awaiting for arteriogram today.  Follow vascular surgery recommendations.  Consultants:  Vascular surgery  Procedures: Plan for arteriogram today.  Antibiotics:  Vancomycin and Zosyn IV  Anti-infectives (From admission, onward)   Start     Dose/Rate Route Frequency Ordered Stop   01/15/20 1800  vancomycin (VANCOCIN) IVPB 1000 mg/200 mL premix     1,000 mg 200 mL/hr over 60 Minutes Intravenous Every 12 hours 01/15/20 0348     01/15/20 1400  piperacillin-tazobactam (ZOSYN) IVPB 3.375 g     3.375 g 12.5 mL/hr over 240 Minutes Intravenous Every 8 hours 01/15/20 0652     01/15/20 0400  vancomycin (VANCOCIN) IVPB 1000 mg/200 mL premix     1,000 mg 200 mL/hr over 60 Minutes Intravenous Every 1 hr x  2 01/15/20 0302 01/15/20 0735   01/15/20 0300  piperacillin-tazobactam (ZOSYN) IVPB 3.375 g     3.375 g 100 mL/hr over 30 Minutes Intravenous  Once 01/15/20 0249 01/15/20 0418     Subjective:  Today, denies interval complaints.  Denies shortness of breath cough fever chills or rigors.  No overt pain.  Objective: Vitals:   01/16/20 1959 01/17/20 0341    BP: 117/64 125/70  Pulse: 73 73  Resp:    Temp: 98.1 F (36.7 C) 98.4 F (36.9 C)  SpO2: 96% 96%    Intake/Output Summary (Last 24 hours) at 01/17/2020 0820 Last data filed at 01/17/2020 0349 Gross per 24 hour  Intake 1815.88 ml  Output --  Net 1815.88 ml   Filed Weights   01/14/20 1813 01/14/20 1817 01/16/20 0500  Weight: 83.5 kg 83 kg 80.5 kg   Body mass index is 24.75 kg/m.   Physical Exam: General:  Average built, not in obvious distress HENT: Normocephalic, pupils equally reacting to light and accommodation.  No scleral pallor or icterus noted. Oral mucosa is moist.  Chest:  Clear breath sounds.  Diminished breath sounds bilaterally. No crackles or wheezes.  CVS: S1 &S2 heard. No murmur.  Regular rate and rhythm. Abdomen: Soft, nontender, nondistended.  Bowel sounds are heard.  Liver is not palpable, no abdominal mass palpated Extremities: Healed left below-knee amputation, right foot with palpable dorsalis pedis.  Fourth toe erythematous, tender.  Dorsal foot- erythematous.  Ulcer of the great toe noted.  Scabs over the right lower leg. Psych: Alert, awake and oriented, normal mood CNS:  No cranial nerve deficits.  Power equal in all extremities.  No sensory deficits noted.  No cerebellar signs.   Skin: Warm and dry with right foot cellulitis.   Data Review: I have personally reviewed the following laboratory data and studies,  CBC: Recent Labs  Lab 01/15/20 0038 01/16/20 0636 01/17/20 0423  WBC 9.9 9.1 7.9  NEUTROABS 6.0  --   --   HGB 13.0 13.0 13.3  HCT 40.2 38.5* 39.2  MCV 94.4 91.4 90.3  PLT 261 220 211   Basic Metabolic Panel: Recent Labs  Lab 01/15/20 0038 01/16/20 0636 01/17/20 0423  NA 136 141 140  K 4.0 4.3 4.4  CL 101 104 103  CO2 '25 28 26  ' GLUCOSE 241* 130* 134*  BUN 31* 15 17  CREATININE 1.00 0.97 0.98  CALCIUM 9.1 9.0 9.2   Liver Function Tests: Recent Labs  Lab 01/16/20 0636  AST 19  ALT 18  ALKPHOS 49  BILITOT 1.0  PROT  6.5  ALBUMIN 3.2*   No results for input(s): LIPASE, AMYLASE in the last 168 hours. No results for input(s): AMMONIA in the last 168 hours. Cardiac Enzymes: No results for input(s): CKTOTAL, CKMB, CKMBINDEX, TROPONINI in the last 168 hours. BNP (last 3 results) No results for input(s): BNP in the last 8760 hours.  ProBNP (last 3 results) No results for input(s): PROBNP in the last 8760 hours.  CBG: Recent Labs  Lab 01/16/20 0810 01/16/20 1214 01/16/20 1717 01/16/20 2221 01/17/20 0642  GLUCAP 132* 151* 122* 177* 173*   Recent Results (from the past 240 hour(s))  Respiratory Panel by RT PCR (Flu A&B, Covid) - Nasopharyngeal Swab     Status: None   Collection Time: 01/15/20  1:20 AM   Specimen: Nasopharyngeal Swab  Result Value Ref Range Status   SARS Coronavirus 2 by RT PCR NEGATIVE NEGATIVE Final    Comment: (NOTE)  SARS-CoV-2 target nucleic acids are NOT DETECTED. The SARS-CoV-2 RNA is generally detectable in upper respiratoy specimens during the acute phase of infection. The lowest concentration of SARS-CoV-2 viral copies this assay can detect is 131 copies/mL. A negative result does not preclude SARS-Cov-2 infection and should not be used as the sole basis for treatment or other patient management decisions. A negative result may occur with  improper specimen collection/handling, submission of specimen other than nasopharyngeal swab, presence of viral mutation(s) within the areas targeted by this assay, and inadequate number of viral copies (<131 copies/mL). A negative result must be combined with clinical observations, patient history, and epidemiological information. The expected result is Negative. Fact Sheet for Patients:  PinkCheek.be Fact Sheet for Healthcare Providers:  GravelBags.it This test is not yet ap proved or cleared by the [place] FDA and  has been authorized for detection and/or diagnosis  of SARS-CoV-2 by FDA under an Emergency Use Authorization (EUA). This EUA will remain  in effect (meaning this test can be used) for the duration of the COVID-19 declaration under Section 564(b)(1) of the Act, 21 U.S.C. section 360bbb-3(b)(1), unless the authorization is terminated or revoked sooner.    Influenza A by PCR NEGATIVE NEGATIVE Final   Influenza B by PCR NEGATIVE NEGATIVE Final    Comment: (NOTE) The Xpert Xpress SARS-CoV-2/FLU/RSV assay is intended as an aid in  the diagnosis of influenza from Nasopharyngeal swab specimens and  should not be used as a sole basis for treatment. Nasal washings and  aspirates are unacceptable for Xpert Xpress SARS-CoV-2/FLU/RSV  testing. Fact Sheet for Patients: PinkCheek.be Fact Sheet for Healthcare Providers: GravelBags.it This test is not yet approved or cleared by the [place] FDA and  has been authorized for detection and/or diagnosis of SARS-CoV-2 by  FDA under an Emergency Use Authorization (EUA). This EUA will remain  in effect (meaning this test can be used) for the duration of the  Covid-19 declaration under Section 564(b)(1) of the Act, 21  U.S.C. section 360bbb-3(b)(1), unless the authorization is  terminated or revoked. Performed at [facility], [address], [place], [place] [address]   SARS CORONAVIRUS 2 (TAT 6-24 HRS) Nasopharyngeal Nasopharyngeal Swab     Status: None   Collection Time: 01/16/20 11:31 AM   Specimen: Nasopharyngeal Swab  Result Value Ref Range Status   SARS Coronavirus 2 NEGATIVE NEGATIVE Final    Comment: (NOTE) SARS-CoV-2 target nucleic acids are NOT DETECTED. The SARS-CoV-2 RNA is generally detectable in upper and lower respiratory specimens during the acute phase of infection. Negative results do not preclude SARS-CoV-2 infection, do not rule out co-infections with other pathogens, and should not be used as the sole basis for treatment  or other patient management decisions. Negative results must be combined with clinical observations, patient history, and epidemiological information. The expected result is Negative. Fact Sheet for Patients: SugarRoll.be Fact Sheet for Healthcare Providers: https://www.woods-mathews.com/ This test is not yet approved or cleared by the [place] FDA and  has been authorized for detection and/or diagnosis of SARS-CoV-2 by FDA under an Emergency Use Authorization (EUA). This EUA will remain  in effect (meaning this test can be used) for the duration of the COVID-19 declaration under Section 56 4(b)(1) of the Act, 21 U.S.C. section 360bbb-3(b)(1), unless the authorization is terminated or revoked sooner. Performed at [facility], [place] [address] [place], [place] [address]      Studies: MR FOOT RIGHT W WO CONTRAST  Result Date: 01/16/2020 CLINICAL  DATA:  Osteomyelitis EXAM: MRI OF THE RIGHT FOREFOOT WITHOUT AND WITH CONTRAST TECHNIQUE: Multiplanar, multisequence MR imaging of the right forefoot was performed before and after the administration of intravenous contrast. CONTRAST:  47m GADAVIST GADOBUTROL 1 MMOL/ML IV SOLN COMPARISON:  X-ray 01/14/2020 FINDINGS: Bones/Joint/Cartilage Extensive bone marrow edema and enhancement within the fourth digit involving the proximal, middle, and distal phalanx with associated low T1 signal changes compatible with acute osteomyelitis (series 6, images 8-12). There are acute fractures of the proximal and middle phalanx with 9 mm of dorsal displacement at the level of the PIP joint (series 8, image 9). Ill-defined fluid with foci of susceptibility suggesting soft tissue gas centered at the PIP joint compatible with septic arthritis. Preservation of the normal marrow signal at the fourth metatarsal head. No fourth MTP joint effusion. Marrow edema and enhancement within the great toe distal phalanx with confluent  low T1 signal changes at the distal tuft (series 6, images 10-12) compatible with acute osteomyelitis. Abnormal T1 marrow signal does not extend to the great toe IP joint. There is no IP joint effusion. Normal marrow signal within the great toe proximal phalanx. Mild marrow edema without definite abnormal T1 signal in the distal phalanx of the fifth digit. Enhancing soft tissue underlying the nail of the fifth digit suggesting nailbed infection. Scattered degenerative changes most prominent at the first MTP joint where there are associated reactive subchondral marrow changes. Ligaments Intact Lisfranc ligament. Collateral ligaments of the foot appear intact. Muscles and Tendons Diffuse edema like signal throughout the visualized intrinsic foot musculature which may reflect denervation changes and/or myositis. Tendinous structures appear grossly intact. Soft tissues Skin ulceration at the plantar lateral aspect of the fifth digit with extensive edema and enhancement throughout the soft tissues of the fourth digit. There is additional area of skin irregularity at the plantar aspect of the great toe distal phalanx with soft tissue edema and enhancement. Abnormal soft tissue noted under the nail of the fifth digit. IMPRESSION: 1. Acute osteomyelitis involving the proximal, middle, and distal phalanx of the right fourth digit. Mildly displaced fractures of the 4th proximal and middle phalanxes. Ill-defined fluid at the fourth PIP joint suspicious for septic arthritis. 2. Acute osteomyelitis of the great toe distal phalanx. 3. Enhancing soft tissue underlying the nail of the fifth digit suggesting nailbed infection. Mild marrow edema within the fifth digit distal phalanx without corresponding low T1 signal. Findings may reflect reactive osteitis. Early acute osteomyelitis at this site be difficult to exclude. 4. Skin ulcerations with cellulitis centered at the fourth digit and right toe distal phalanx. 5. Diffuse edema  like signal throughout the visualized intrinsic foot musculature which may reflect denervation changes and/or myositis. These results will be called to the ordering clinician or representative by the Radiologist Assistant, and communication documented in the PACS or zVision Dashboard. Electronically Signed   By: NDavina PokeD.O.   On: 01/16/2020 10:34   VAS [place]ABI WITH/WO TBI  Result Date: 01/16/2020 LOWER EXTREMITY DOPPLER STUDY Indications: Ulceration, gangrene, peripheral artery disease, and Acute              osteomyelitis of phalanx of right foot and [name]. High Risk Factors: Hypertension, hyperlipidemia, Diabetes.  Comparison Study: No prior study on file Performing Technologist: KSharion DoveRVS  Examination Guidelines: A complete evaluation includes at minimum, Doppler waveform signals and systolic blood pressure reading at the level of bilateral brachial, anterior tibial, and posterior tibial arteries, when vessel segments are accessible. Bilateral  testing is considered an integral part of a complete examination. Photoelectric Plethysmograph (PPG) waveforms and toe systolic pressure readings are included as required and additional duplex testing as needed. Limited examinations for reoccurring indications may be performed as noted.  ABI Findings: +---------+------------------+-----+----------+--------+ Right    Rt Pressure (mmHg)IndexWaveform  Comment  +---------+------------------+-----+----------+--------+ Brachial 128                    triphasic          +---------+------------------+-----+----------+--------+ PTA      234               1.83 biphasic           +---------+------------------+-----+----------+--------+ DP       255               1.99 monophasic         +---------+------------------+-----+----------+--------+ Great Toe94                0.73                    +---------+------------------+-----+----------+--------+  +--------+------------------+-----+---------+-------+ Left    Lt Pressure (mmHg)IndexWaveform Comment +--------+------------------+-----+---------+-------+ [id number]                    triphasic        +--------+------------------+-----+---------+-------+ PTA                                     BKA     +--------+------------------+-----+---------+-------+ DP                                      BKA     +--------+------------------+-----+---------+-------+ +-------+-----------+-----------+------------+------------+ ABI/TBIToday's ABIToday's TBIPrevious ABIPrevious TBI +-------+-----------+-----------+------------+------------+ Right  1.99       0.73                                +-------+-----------+-----------+------------+------------+ Left   BKA                                            +-------+-----------+-----------+------------+------------+ Arterial wall calcification precludes accurate ankle pressures and ABIs.  Summary: Right: Resting right ankle-brachial index indicates noncompressible right lower extremity arteries. The right toe-brachial index is normal. ABIs are unreliable.  *See table(s) above for measurements and observations.    Preliminary     Scheduled Meds: . aspirin EC  81 mg Oral Daily  . atorvastatin  20 mg Oral Daily  . enoxaparin (LOVENOX) injection  40 mg Subcutaneous Q24H  . insulin aspart  0-15 Units Subcutaneous TID WC  . insulin aspart  0-5 Units Subcutaneous QHS  . insulin glargine  15 Units Subcutaneous Daily  . lisinopril  20 mg Oral Daily  . sodium chloride flush  3 mL Intravenous Q12H    Continuous Infusions: . sodium chloride    . piperacillin-tazobactam (ZOSYN)  IV 3.375 g (01/17/20 0548)  . vancomycin 1,000 mg (01/17/20 0546)     Flora Lipps, MD  [facility] 01/17/2020

## 2020-01-17 NOTE — Consult Note (Signed)
WOC Nurse Consult Note: Patient receiving care in [facility] 5N07.  Consult completed remotely after review of record, including photos of right foot. Reason for Consult: diabetic ulcer- rt foot Wound type: osteomyelitis of multiple places of right foot per MRI results Pressure Injury POA: Yes/No/NA Measurement: Wound bed: Drainage (amount, consistency, odor)  Periwound: Dressing procedure/placement/frequency:  Wrap right foot in kerlex. Change daily. The care of this foot requires the services of services exceeding the scope of practice of the WOC nurses.  Please consult surgery for intervention. Helmut Muster, RN, MSN, CWOCN, CNS-BC, pager [phone]

## 2020-01-17 NOTE — TOC Initial Note (Signed)
Transition of Care [facility]) - Initial/Assessment Note    Patient Details  Name: Donald Duran MRN: [id number] Date of Birth: 04/16/1951  Transition of Care [facility]) CM/SW Contact:    Epifanio Lesches, RN Phone Number: 01/17/2020, 2:24 PM  Clinical Narrative:     Presents with Cellulitis / R diabetic foot infection/acute osteomyelitis R foot. Hx of  [name] in 2017, PAD.  From home with mom who is a CNA. Pt states PTA independent with ADL's. Already has walkers, W/C.  Pt states hoping to manage care per , along with mom if needed @ d/c .Marland Kitchen..    Plan: aortogram with right lower extremity arteriogram.   TOC will continue to monitor for needs ...  Expected Discharge Plan: Home/Self Care(From home with mom who is a CNA) Barriers to Discharge: Continued Medical Work up   Patient Goals and CMS Choice        Expected Discharge Plan and Services Expected Discharge Plan: Home/Self Care(From home with mom who is a Lawyer)   Discharge Planning Services: CM Consult                     DME Arranged: N/A DME Agency: NA       HH Arranged: NA HH Agency: NA        Prior Living Arrangements/Services   Lives with:: Parents Patient language and need for interpreter reviewed:: Yes Do you feel safe going back to the place where you live?: Yes      Need for Family Participation in Patient Care: Yes (Comment) Care giver support system in place?: Yes (comment)   Criminal Activity/Legal Involvement Pertinent to Current Situation/Hospitalization: No - Comment as needed  Activities of Daily Living Home Assistive Devices/Equipment: Prosthesis ADL Screening (condition at time of admission) Patient's cognitive ability adequate to safely complete daily activities?: Yes Is the patient deaf or have difficulty hearing?: No Does the patient have difficulty seeing, even when wearing glasses/contacts?: No Does the patient have difficulty concentrating, remembering, or making decisions?:  No Patient able to express need for assistance with ADLs?: Yes Does the patient have difficulty dressing or bathing?: No Independently performs ADLs?: Yes (appropriate for developmental age) Does the patient have difficulty walking or climbing stairs?: Yes Weakness of Legs: None Weakness of Arms/Hands: None  Permission Sought/Granted   Permission granted to share information with : Yes, Verbal Permission Granted  Share Information with NAME: Pecola Leisure (Mother) [phone]           Emotional Assessment Appearance:: Appears stated age Attitude/Demeanor/Rapport: Self-Confident Affect (typically observed): Accepting Orientation: : Oriented to Self, Oriented to Place, Oriented to  Time, Oriented to Situation Alcohol / Substance Use: Not Applicable Psych Involvement: No (comment)  Admission diagnosis:  Cellulitis [L03.90] 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 Cellulitis 01/15/2020  . Diabetic foot infection (HCC) 01/15/2020  . 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 Pyogenic inflammation of bone (HCC)   . Type II diabetes mellitus, uncontrolled (HCC) 08/13/2017  . Essential hypertension, benign 08/13/2017  . Mixed hyperlipidemia 08/13/2017   PCP:  Gennette Pac, NP Pharmacy:   [facility] [address], [place] - [address] [place] [address] [place] [address] [place] [address] Phone: [phone] Fax: [phone]  [facility] [address] [place] - [address] [place] [address]  Phone: [phone] Fax: [phone]  [id number] - [place], [place] [address] [place] [address] Phone: [phone] Fax: [phone]     Social Determinants of Health (SDOH) Interventions    Readmission Risk Interventions No flowsheet data  found.

## 2020-01-17 NOTE — Progress Notes (Signed)
[facility]  Subjective  - tissue loss right foot.   Objective 125/70 73 98.4 F (36.9 C) (Oral) 18 99%  Intake/Output Summary (Last 24 hours) at 01/17/2020 1531 Last data filed at 01/17/2020 1402 Gross per 24 hour  Intake 1458.88 ml  Output 450 ml  Net 1008.88 ml    Right femoral and popliteal palpable - weak right DP [name]  Laboratory Lab Results: Recent Labs    01/16/20 0636 01/17/20 0423  WBC 9.1 7.9  HGB 13.0 13.3  HCT 38.5* 39.2  PLT 220 231   BMET Recent Labs    01/16/20 0636 01/17/20 0423  NA 141 140  K 4.3 4.4  CL 104 103  CO2 28 26  GLUCOSE 130* 134*  BUN 15 17  CREATININE 0.97 0.98  CALCIUM 9.0 9.2    COAG Lab Results  Component Value Date   INR 1.0 01/17/2020   No results found for: PTT  Assessment/Planning:  Plan for aortogram, right leg arteriogram for CLI with rest pain.  Risks and benefits discussed.   Cephus Shelling 01/17/2020 3:31 PM --

## 2020-01-18 ENCOUNTER — Inpatient Hospital Stay: Payer: Self-pay

## 2020-01-18 DIAGNOSIS — E1169 Type 2 diabetes mellitus with other specified complication: Principal | ICD-10-CM

## 2020-01-18 DIAGNOSIS — I10 Essential (primary) hypertension: Secondary | ICD-10-CM

## 2020-01-18 DIAGNOSIS — Z7722 Contact with and (suspected) exposure to environmental tobacco smoke (acute) (chronic): Secondary | ICD-10-CM

## 2020-01-18 DIAGNOSIS — M84474A Pathological fracture, right foot, initial encounter for fracture: Secondary | ICD-10-CM

## 2020-01-18 DIAGNOSIS — M86171 Other acute osteomyelitis, right ankle and foot: Secondary | ICD-10-CM

## 2020-01-18 DIAGNOSIS — Z89512 Acquired absence of left leg below knee: Secondary | ICD-10-CM

## 2020-01-18 DIAGNOSIS — L03115 Cellulitis of right lower limb: Secondary | ICD-10-CM

## 2020-01-18 DIAGNOSIS — L97519 Non-pressure chronic ulcer of other part of right foot with unspecified severity: Secondary | ICD-10-CM

## 2020-01-18 DIAGNOSIS — E1151 Type 2 diabetes mellitus with diabetic peripheral angiopathy without gangrene: Secondary | ICD-10-CM

## 2020-01-18 DIAGNOSIS — E11621 Type 2 diabetes mellitus with foot ulcer: Secondary | ICD-10-CM

## 2020-01-18 LAB — GLUCOSE, CAPILLARY
Glucose-Capillary: 182 mg/dL — ABNORMAL HIGH (ref 70–99)
Glucose-Capillary: 253 mg/dL — ABNORMAL HIGH (ref 70–99)
Glucose-Capillary: 126 mg/dL — ABNORMAL HIGH (ref 70–99)
Glucose-Capillary: 171 mg/dL — ABNORMAL HIGH (ref 70–99)

## 2020-01-18 LAB — VANCOMYCIN, PEAK: Vancomycin Pk: 20 ug/mL — ABNORMAL LOW (ref 30–40)

## 2020-01-18 MED ORDER — SODIUM CHLORIDE 0.9% FLUSH: 10.0000 mL | INTRAVENOUS | Status: DC | PRN

## 2020-01-18 MED ORDER — SODIUM CHLORIDE 0.9 % IV SOLN
2.0000 g | INTRAVENOUS | Status: DC
  Administered 2020-01-18 – 2020-01-19 (×2): 2 g via INTRAVENOUS
  Filled 2020-01-18 (×2): qty 20

## 2020-01-18 MED ORDER — CEFTRIAXONE IV (FOR PTA / DISCHARGE USE ONLY): 2.0000 g | INTRAVENOUS | 0 refills | Status: AC

## 2020-01-18 MED ORDER — VANCOMYCIN HCL 1250 MG/250ML IV SOLN
1250.0000 mg | Freq: Two times a day (BID) | INTRAVENOUS | Status: DC
  Administered 2020-01-19: 1250 mg via INTRAVENOUS
  Filled 2020-01-18 (×2): qty 250

## 2020-01-18 MED ORDER — CHLORHEXIDINE GLUCONATE CLOTH 2 % EX PADS
6.0000 | MEDICATED_PAD | Freq: Every day | CUTANEOUS | Status: DC
  Administered 2020-01-18 – 2020-01-19 (×2): 6 via TOPICAL

## 2020-01-18 MED ORDER — VANCOMYCIN IV (FOR PTA / DISCHARGE USE ONLY): 1000.0000 mg | Freq: Two times a day (BID) | INTRAVENOUS | 0 refills | Status: DC

## 2020-01-18 MED ORDER — CHLORHEXIDINE GLUCONATE CLOTH 2 % EX PADS: 6.0000 | MEDICATED_PAD | Freq: Every day | CUTANEOUS | Status: DC

## 2020-01-18 NOTE — Progress Notes (Addendum)
PROGRESS NOTE  Donald Duran XBJ:[id number] DOB: May 24, 1951 DOA: 01/15/2020 PCP: Wyatt Haste, NP   LOS: 3 days   Brief narrative: As per HPI,  69 y/o male with a history of [name] in 2017, peripheral vascular disease presenting with erythema, drainage, and pain on his right foot particularly his fourth toe.  The patient states that he was seen in the ED on 11/27/2019 for his diabetic foot infection involving the right fourth toe at that time.  He was discharged home with cephalexin.  Apparently, it had gradually improved but not completely resolved in terms of this ulceration or erythema.  He was placed on another course of cephalexin by his PCP.  Once again there was some mild improvement while he was on antibiotics.  He finished antibiotics approximately 10 days prior to this admission.  Since then, he has noted increasing erythema, drainage, and foul odor.  As result, he presented for further evaluation.   In the emergency department, the patient was afebrile, hemodynamically stable with oxygen saturation 98% room air.  BMP and CBC were essentially unremarkable with WBC 9.9.  X-ray of the right foot was suspicious for osteomyelitis of the distal fifth toe and fourth middle phalanx.  ESR 50, CRP 1.3.  Patient was then admitted to hospital for further evaluation and treatment.  Assessment/Plan:  Principal Problem:   Cellulitis Active Problems:   Type II diabetes mellitus, uncontrolled (HCC)   Essential hypertension, benign   Mixed hyperlipidemia   Diabetic foot infection ([place])   Acute osteomyelitis of phalanx of right foot (HCC)  Diabetic foot infection/acute osteomyelitis right foot Seen by  vascular surgery. Had seen Dr. Trula Slade in the past on 10/02/2016, but he was lost to follow-up and never followed through with ABIs.  ABI was reported as unreliable.  on vanco and zosyn.  MRI of the foot  showed osteomyelitis of the proximal middle and distal phalanx of the right fourth digit  with mildly displaced fracture and PIP joint septic arthritis.  Status post abdominal aortogram with lower extremity angiogram on 1/18 with no flow-limiting aortoiliac disease.  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  There was no flow-limiting lesion requiring intervention.  Vascular surgery recommended outpatient antibiotic on discharge no surgical intervention at this time.  Consulted ID for outpatient antibiotic recommendation.  ID recommends Rocephin and vancomycin on discharge.  Patient will undergo PICC line placement today.  Essential hypertension Blood pressure seems to be stable.  On lisinopril.  Creatinine of 0.98  Uncontrolled diabetes mellitus type 2 with hyperglycemia -Hemoglobin A1c of 7.0.  On low-dose Lantus, sliding scale insulin.  Continue to hold Metformin.  Continue diabetic diet, acuchecks.  Last point-of-care glucose was 182.  Peripheral vascular disease -Vascular surgery on board. Continue aspirin,lipitor,  Hyperlipidemia -Continue statin  VTE Prophylaxis: subq lovenox   Code Status:  Full code   Family Communication:  none  Disposition Plan:  Home with home health likely by 01/19/20.  Await PICC placement and home health arrangement. Antibiotics (OPAT) have been signed. Spoke to nursing staff and [facility] consult for Home health   Consultants:  Vascular surgery  Procedures: Abdominal aortogram with lower extremity angiogram on 1/18  Antibiotics:  Vancomycin and Zosyn IV 01/15/20>  Anti-infectives (From admission, onward)   Start     Dose/Rate Route Frequency Ordered Stop   01/15/20 1800  vancomycin (VANCOCIN) IVPB 1000 mg/200 mL premix     1,000 mg 200 mL/hr over 60 Minutes Intravenous Every 12 hours  01/15/20 0348     01/15/20 1400  piperacillin-tazobactam (ZOSYN) IVPB 3.375 g     3.375 g 12.5 mL/hr over 240 Minutes Intravenous Every 8 hours 01/15/20 0652     01/15/20 0400  vancomycin (VANCOCIN) IVPB 1000 mg/200 mL premix     1,000  mg 200 mL/hr over 60 Minutes Intravenous Every 1 hr x 2 01/15/20 0302 01/15/20 0735   01/15/20 0300  piperacillin-tazobactam (ZOSYN) IVPB 3.375 g     3.375 g 100 mL/hr over 30 Minutes Intravenous  Once 01/15/20 0249 01/15/20 0418     Subjective:  Today, Patient denies interval complaints.  Denies overt pain, fever chills or rigors.  Objective: Vitals:   01/17/20 2011 01/18/20 0356  BP: 114/68 135/71  Pulse: 75 75  Resp: 19 19  Temp: 98.2 F (36.8 C) 98.6 F (37 C)  SpO2: 99% 96%    Intake/Output Summary (Last 24 hours) at 01/18/2020 0810 Last data filed at 01/18/2020 0500 Gross per 24 hour  Intake 1377.08 ml  Output 1050 ml  Net 327.08 ml   Filed Weights   01/14/20 1813 01/14/20 1817 01/16/20 0500  Weight: 83.5 kg 83 kg 80.5 kg   Body mass index is 24.75 kg/m.   Physical Exam:  General:  Average built, not in obvious distress HENT: Normocephalic, pupils equally reacting to light and accommodation.  No scleral pallor or icterus noted. Oral mucosa is moist.  Chest:  Clear breath sounds.  Diminished breath sounds bilaterally. No crackles or wheezes.  CVS: S1 &S2 heard. No murmur.  Regular rate and rhythm. Abdomen: Soft, nontender, nondistended.  Bowel sounds are heard.  Liver is not palpable, no abdominal mass palpated Extremities: Healed left below-knee amputation, right foot with palpable dorsalis pedis.  Fourth toe erythematous, tender but improved.  Dorsal foot- erythematous but improved.  Ulcer of the great toe noted.  Scabs over the right lower leg. Psych: Alert, awake and oriented, normal mood CNS:  No cranial nerve deficits.  Power equal in all extremities.  No sensory deficits noted.  No cerebellar signs.   Skin: Warm and dry with right foot cellulitis improved.   Data Review: I have personally reviewed the following laboratory data and studies,  CBC: Recent Labs  Lab 01/15/20 0038 01/16/20 0636 01/17/20 0423  WBC 9.9 9.1 7.9  NEUTROABS 6.0  --   --     HGB 13.0 13.0 13.3  HCT 40.2 38.5* 39.2  MCV 94.4 91.4 90.3  PLT 261 220 174   Basic Metabolic Panel: Recent Labs  Lab 01/15/20 0038 01/16/20 0636 01/17/20 0423  NA 136 141 140  K 4.0 4.3 4.4  CL 101 104 103  CO2 '25 28 26  ' GLUCOSE 241* 130* 134*  BUN 31* 15 17  CREATININE 1.00 0.97 0.98  CALCIUM 9.1 9.0 9.2   Liver Function Tests: Recent Labs  Lab 01/16/20 0636  AST 19  ALT 18  ALKPHOS 49  BILITOT 1.0  PROT 6.5  ALBUMIN 3.2*   No results for input(s): LIPASE, AMYLASE in the last 168 hours. No results for input(s): AMMONIA in the last 168 hours. Cardiac Enzymes: No results for input(s): CKTOTAL, CKMB, CKMBINDEX, TROPONINI in the last 168 hours. BNP (last 3 results) No results for input(s): BNP in the last 8760 hours.  ProBNP (last 3 results) No results for input(s): PROBNP in the last 8760 hours.  CBG: Recent Labs  Lab 01/17/20 1106 01/17/20 1627 01/17/20 1855 01/17/20 2102 01/18/20 0643  GLUCAP 104* 93 189* 269*  182*   Recent Results (from the past 240 hour(s))  Respiratory Panel by RT PCR (Flu A&B, Covid) - Nasopharyngeal Swab     Status: None   Collection Time: 01/15/20  1:20 AM   Specimen: Nasopharyngeal Swab  Result Value Ref Range Status   SARS Coronavirus 2 by RT PCR NEGATIVE NEGATIVE Final    Comment: (NOTE) SARS-CoV-2 target nucleic acids are NOT DETECTED. The SARS-CoV-2 RNA is generally detectable in upper respiratoy specimens during the acute phase of infection. The lowest concentration of SARS-CoV-2 viral copies this assay can detect is 131 copies/mL. A negative result does not preclude SARS-Cov-2 infection and should not be used as the sole basis for treatment or other patient management decisions. A negative result may occur with  improper specimen collection/handling, submission of specimen other than nasopharyngeal swab, presence of viral mutation(s) within the areas targeted by this assay, and inadequate number of viral  copies (<131 copies/mL). A negative result must be combined with clinical observations, patient history, and epidemiological information. The expected result is Negative. Fact Sheet for Patients:  PinkCheek.be Fact Sheet for Healthcare Providers:  GravelBags.it This test is not yet ap proved or cleared by the [place] FDA and  has been authorized for detection and/or diagnosis of SARS-CoV-2 by FDA under an Emergency Use Authorization (EUA). This EUA will remain  in effect (meaning this test can be used) for the duration of the COVID-19 declaration under Section 564(b)(1) of the Act, 21 U.S.C. section 360bbb-3(b)(1), unless the authorization is terminated or revoked sooner.    Influenza A by PCR NEGATIVE NEGATIVE Final   Influenza B by PCR NEGATIVE NEGATIVE Final    Comment: (NOTE) The Xpert Xpress SARS-CoV-2/FLU/RSV assay is intended as an aid in  the diagnosis of influenza from Nasopharyngeal swab specimens and  should not be used as a sole basis for treatment. Nasal washings and  aspirates are unacceptable for Xpert Xpress SARS-CoV-2/FLU/RSV  testing. Fact Sheet for Patients: PinkCheek.be Fact Sheet for Healthcare Providers: GravelBags.it This test is not yet approved or cleared by the [place] FDA and  has been authorized for detection and/or diagnosis of SARS-CoV-2 by  FDA under an Emergency Use Authorization (EUA). This EUA will remain  in effect (meaning this test can be used) for the duration of the  Covid-19 declaration under Section 564(b)(1) of the Act, 21  U.S.C. section 360bbb-3(b)(1), unless the authorization is  terminated or revoked. Performed at [facility], [address], [place], [place] [address]   SARS CORONAVIRUS 2 (TAT 6-24 HRS) Nasopharyngeal Nasopharyngeal Swab     Status: None   Collection Time: 01/16/20 11:31 AM   Specimen:  Nasopharyngeal Swab  Result Value Ref Range Status   SARS Coronavirus 2 NEGATIVE NEGATIVE Final    Comment: (NOTE) SARS-CoV-2 target nucleic acids are NOT DETECTED. The SARS-CoV-2 RNA is generally detectable in upper and lower respiratory specimens during the acute phase of infection. Negative results do not preclude SARS-CoV-2 infection, do not rule out co-infections with other pathogens, and should not be used as the sole basis for treatment or other patient management decisions. Negative results must be combined with clinical observations, patient history, and epidemiological information. The expected result is Negative. Fact Sheet for Patients: SugarRoll.be Fact Sheet for Healthcare Providers: https://www.woods-mathews.com/ This test is not yet approved or cleared by the [place] FDA and  has been authorized for detection and/or diagnosis of SARS-CoV-2 by FDA under an Emergency Use Authorization (EUA). This EUA will remain  in  effect (meaning this test can be used) for the duration of the COVID-19 declaration under Section 56 4(b)(1) of the Act, 21 U.S.C. section 360bbb-3(b)(1), unless the authorization is terminated or revoked sooner. Performed at [facility], [place] [address] [place], [place] [address]      Studies: MR FOOT RIGHT W WO CONTRAST  Result Date: 01/16/2020 CLINICAL DATA:  Osteomyelitis EXAM: MRI OF THE RIGHT FOREFOOT WITHOUT AND WITH CONTRAST TECHNIQUE: Multiplanar, multisequence MR imaging of the right forefoot was performed before and after the administration of intravenous contrast. CONTRAST:  67m GADAVIST GADOBUTROL 1 MMOL/ML IV SOLN COMPARISON:  X-ray 01/14/2020 FINDINGS: Bones/Joint/Cartilage Extensive bone marrow edema and enhancement within the fourth digit involving the proximal, middle, and distal phalanx with associated low T1 signal changes compatible with acute osteomyelitis (series 6, images 8-12).  There are acute fractures of the proximal and middle phalanx with 9 mm of dorsal displacement at the level of the PIP joint (series 8, image 9). Ill-defined fluid with foci of susceptibility suggesting soft tissue gas centered at the PIP joint compatible with septic arthritis. Preservation of the normal marrow signal at the fourth metatarsal head. No fourth MTP joint effusion. Marrow edema and enhancement within the great toe distal phalanx with confluent low T1 signal changes at the distal tuft (series 6, images 10-12) compatible with acute osteomyelitis. Abnormal T1 marrow signal does not extend to the great toe IP joint. There is no IP joint effusion. Normal marrow signal within the great toe proximal phalanx. Mild marrow edema without definite abnormal T1 signal in the distal phalanx of the fifth digit. Enhancing soft tissue underlying the nail of the fifth digit suggesting nailbed infection. Scattered degenerative changes most prominent at the first MTP joint where there are associated reactive subchondral marrow changes. Ligaments Intact Lisfranc ligament. Collateral ligaments of the foot appear intact. Muscles and Tendons Diffuse edema like signal throughout the visualized intrinsic foot musculature which may reflect denervation changes and/or myositis. Tendinous structures appear grossly intact. Soft tissues Skin ulceration at the plantar lateral aspect of the fifth digit with extensive edema and enhancement throughout the soft tissues of the fourth digit. There is additional area of skin irregularity at the plantar aspect of the great toe distal phalanx with soft tissue edema and enhancement. Abnormal soft tissue noted under the nail of the fifth digit. IMPRESSION: 1. Acute osteomyelitis involving the proximal, middle, and distal phalanx of the right fourth digit. Mildly displaced fractures of the 4th proximal and middle phalanxes. Ill-defined fluid at the fourth PIP joint suspicious for septic arthritis.  2. Acute osteomyelitis of the great toe distal phalanx. 3. Enhancing soft tissue underlying the nail of the fifth digit suggesting nailbed infection. Mild marrow edema within the fifth digit distal phalanx without corresponding low T1 signal. Findings may reflect reactive osteitis. Early acute osteomyelitis at this site be difficult to exclude. 4. Skin ulcerations with cellulitis centered at the fourth digit and right toe distal phalanx. 5. Diffuse edema like signal throughout the visualized intrinsic foot musculature which may reflect denervation changes and/or myositis. These results will be called to the ordering clinician or representative by the Radiologist Assistant, and communication documented in the PACS or zVision Dashboard. Electronically Signed   By: NDavina PokeD.O.   On: 01/16/2020 10:34   PERIPHERAL VASCULAR CATHETERIZATION  Result Date: 01/17/2020 Patient name: GDeontre Allsup     MRN: [id number]       DOB: 309-02-1951  Sex: male  01/17/2020 Pre-operative Diagnosis: Right lower extremity tissue loss Post-operative diagnosis:  Same Surgeon:  Marty Heck, MD Procedure Performed: 1.  Ultrasound-guided access of the left common femoral artery 2.  Aortogram 3.  Bilateral lower extremity arteriogram with runoff 4.  27 minutes of monitored moderate conscious sedation 5.  Mynx closure of the left common femoral artery  Indications: 69 year old male with history of left below-knee amputation as well as diabetes that presented over the weekend with tissue loss of his right lower extremity over the last 4 to 6 weeks.  Ultimately he had noncompressible ABIs with abnormal waveforms at the ankle.  He presented today for planned aortogram, lower extremity arteriogram, and possible intervention after risks and benefits were discussed..  Findings:  Aortogram showed no flow-limiting aortoiliac disease.  Right lower extremity arteriogram showed a widely patent common femoral, profunda,  SFA, above and below-knee popliteal artery and two-vessel runoff via a widely patent anterior tibial and peroneal arteries.  His posterior tibial was occluded throughout its course.  He had excellent flow into the foot dominantly through the dorsalis pedis with even filling of the digital arteries distally in the toes.  No flow-limiting lesions that require intervention.  He should have adequate inline flow for wound healing in the right foot.              Procedure:  The patient was identified in the holding area and taken to room 8.  The patient was then placed supine on the table and prepped and draped in the usual sterile fashion.  A time out was called.  Ultrasound was used to evaluate the left common femoral artery.  It was patent .  A digital ultrasound image was acquired.  A micropuncture needle was used to access the left common femoral artery under ultrasound guidance.  An 018 wire was advanced without resistance and a micropuncture sheath was placed.  The 018 wire was removed and a benson wire was placed.  The micropuncture sheath was exchanged for a 5 french sheath.  An omniflush catheter was advanced over the wire to the level of L-1.  An abdominal angiogram was obtained.  Given that he had some motion artifact on the initial aortogram, we pulled the catheter down and got a right iliac arteriogram.  Given no flow-limiting lesions in the aortoiliac segment, I then crossed the aortic bifurcation into the right external iliac with a Glidewire and Omni Flush.  We advanced our Omni Flush catheter to the distal external iliac.  Right lower extremity runoff was then obtained.  Pertinent findings are noted above, but ultimately he has inline flow throughout the right lower extremity through the common femoral, SFA, popliteal, and two-vessel runoff via the anterior tibial and peroneal arteries.  There is no flow-limiting lesions that require intervention and he should have adequate flow for wound healing.  At  that point in time wires and catheters removed.  Mynx closure device was deployed in the left groin.  Plan: Patient should have adequate inline flow for healing of his right toe wounds.  He will need antibiotics as well as good wound care.  Certainly if his wounds continue to progress we could consider toe amputations if needed.  Marty Heck, MD [facility] [place] Office: [phone]  Christopher J Clark  VAS [place] ABI WITH/WO TBI  Result Date: 01/17/2020 LOWER EXTREMITY DOPPLER STUDY Indications: Ulceration, gangrene, peripheral artery disease, and Acute  osteomyelitis of phalanx of right foot and [name]. High Risk Factors: Hypertension, hyperlipidemia, Diabetes.  Comparison Study: No prior study on file Performing Technologist: Sharion Dove RVS  Examination Guidelines: A complete evaluation includes at minimum, Doppler waveform signals and systolic blood pressure reading at the level of bilateral brachial, anterior tibial, and posterior tibial arteries, when vessel segments are accessible. Bilateral testing is considered an integral part of a complete examination. Photoelectric Plethysmograph (PPG) waveforms and toe systolic pressure readings are included as required and additional duplex testing as needed. Limited examinations for reoccurring indications may be performed as noted.  ABI Findings: +---------+------------------+-----+----------+--------+ Right    Rt Pressure (mmHg)IndexWaveform  Comment  +---------+------------------+-----+----------+--------+ Brachial 128                    triphasic          +---------+------------------+-----+----------+--------+ PTA      234               1.83 biphasic           +---------+------------------+-----+----------+--------+ DP       255               1.99 monophasic         +---------+------------------+-----+----------+--------+ Great Toe94                0.73                     +---------+------------------+-----+----------+--------+ +--------+------------------+-----+---------+-------+ Left    Lt Pressure (mmHg)IndexWaveform Comment +--------+------------------+-----+---------+-------+ [id number]                    triphasic        +--------+------------------+-----+---------+-------+ PTA                                     BKA     +--------+------------------+-----+---------+-------+ DP                                      BKA     +--------+------------------+-----+---------+-------+ +-------+-----------+-----------+------------+------------+ ABI/TBIToday's ABIToday's TBIPrevious ABIPrevious TBI +-------+-----------+-----------+------------+------------+ Right  1.99       0.73                                +-------+-----------+-----------+------------+------------+ Left   BKA                                            +-------+-----------+-----------+------------+------------+ Arterial wall calcification precludes accurate ankle pressures and ABIs.  Summary: Right: Resting right ankle-brachial index indicates noncompressible right lower extremity arteries. The right toe-brachial index is normal. ABIs are unreliable.  *See table(s) above for measurements and observations.  Electronically signed by Ruta Hinds MD on 01/17/2020 at 2:50:00 PM.   Final     Scheduled Meds: . aspirin EC  81 mg Oral Daily  . atorvastatin  20 mg Oral Daily  . enoxaparin (LOVENOX) injection  40 mg Subcutaneous Q24H  . insulin aspart  0-15 Units Subcutaneous TID WC  . insulin aspart  0-5 Units Subcutaneous QHS  . insulin glargine  15 Units Subcutaneous Daily  . lisinopril  20 mg Oral Daily  .  sodium chloride flush  3 mL Intravenous Q12H  . sodium chloride flush  3 mL Intravenous Q12H    Continuous Infusions: . sodium chloride    . sodium chloride 100 mL/hr at 01/17/20 2014  . sodium chloride    . piperacillin-tazobactam (ZOSYN)  IV 3.375 g (01/18/20  0649)  . vancomycin 1,000 mg (01/18/20 0559)     Flora Lipps, MD  [facility] 01/18/2020

## 2020-01-18 NOTE — Progress Notes (Signed)
PHARMACY CONSULT NOTE FOR:  OUTPATIENT  PARENTERAL ANTIBIOTIC THERAPY (OPAT)  Indication: Osteomyelitis Regimen: Vancomycin 1,250 mg IV Q12 hrs and Ceftriaxone 2 g IV Q24 hrs End date: 02/28/20  IV antibiotic discharge orders are pended. To discharging provider:  please sign these orders via discharge navigator,  Select New Orders & click on the button choice - Manage This Unsigned Work.     Thank you for allowing pharmacy to be a part of this patient's care.  Link Snuffer, PharmD, BCPS, BCCCP Clinical Pharmacist Please refer to [facility] for [facility] Pharmacy numbers 01/18/2020  10:07 PM  Please check [email or web address] for unit-specific pharmacy phone numbers.

## 2020-01-18 NOTE — Progress Notes (Signed)
Peripherally Inserted Central Catheter/Midline Placement  The IV Nurse has discussed with the patient and/or persons authorized to consent for the patient, the purpose of this procedure and the potential benefits and risks involved with this procedure.  The benefits include less needle sticks, lab draws from the catheter, and the patient may be discharged home with the catheter. Risks include, but not limited to, infection, bleeding, blood clot (thrombus formation), and puncture of an artery; nerve damage and irregular heartbeat and possibility to perform a PICC exchange if needed/ordered by physician.  Alternatives to this procedure were also discussed.  Bard Power PICC patient education guide, fact sheet on infection prevention and patient information card has been provided to patient /or left at bedside.    PICC/Midline Placement Documentation  PICC Single Lumen 01/18/20 PICC Right Basilic 42 cm 1 cm (Active)  Indication for Insertion or Continuance of Line Home intravenous therapies (PICC only) 01/18/20 1630  Exposed Catheter (cm) 0 cm 01/18/20 1630  Site Assessment Clean;Intact;Dry 01/18/20 1630  Line Status Flushed;Blood return noted;Saline locked 01/18/20 1630  Dressing Type Transparent 01/18/20 1630  Dressing Status Clean;Dry;Intact;Antimicrobial disc in place 01/18/20 1630  Dressing Change Due 01/25/20 01/18/20 1630       Audrie Gallus 01/18/2020, 4:34 PM

## 2020-01-18 NOTE — Plan of Care (Signed)
  Problem: Skin Integrity: Goal: Risk for impaired skin integrity will decrease Outcome: Progressing   Problem: Safety: Goal: Ability to remain free from injury will improve Outcome: Progressing   Problem: Pain Managment: Goal: General experience of comfort will improve Outcome: Progressing   Problem: Elimination: Goal: Will not experience complications related to bowel motility Outcome: Progressing   

## 2020-01-18 NOTE — Progress Notes (Signed)
Pharmacy Antibiotic Note  Donald Duran is a 69 y.o. male admitted on 01/15/2020 with diabetic foot infection with osteo.  Pharmacy has been consulted for Vanco/Zosyn dosing.  ID: Abx for R-DFI, imaging concerning for osteo, hx prior L-BKA. Afeb, WBC wnl. No cultures - 1/15 R-foot X-ray: susp for osteo of 5th dist toe tuft/4th toe mid phalanx/prox interphalangeal joint  Keflex PTA Zosyn 1/16 >>      Vancomycin 1/16>>    1/16 COVID >> neg x 2  Plan: - Cont Zosyn 3.375g IV q8h (4-hr inf) - Cont Vanc 1g/12h (est AUC 488, SCr 0.97, Vd 0.72) - abx x 6 weeks - Will order levels for tomorrow AM.    Height: 5\' 11"  (180.3 cm) Weight: 177 lb 7.5 oz (80.5 kg) IBW/kg (Calculated) : 75.3  Temp (24hrs), Avg:98.2 F (36.8 C), Min:97.4 F (36.3 C), Max:98.6 F (37 C)  Recent Labs  Lab 01/15/20 0038 01/16/20 0636 01/17/20 0423  WBC 9.9 9.1 7.9  CREATININE 1.00 0.97 0.98    Estimated Creatinine Clearance: 79 mL/min (by C-G formula based on SCr of 0.98 mg/dL).    No Known Allergies  Andros Channing S. 01/19/20, PharmD, BCPS Clinical Staff Pharmacist [email or web address] Merilynn Finland 01/18/2020 10:07 AM

## 2020-01-18 NOTE — Progress Notes (Signed)
PHARMACY CONSULT NOTE FOR:  OUTPATIENT  PARENTERAL ANTIBIOTIC THERAPY (OPAT)  Indication: Osteomyelitis Regimen: Vancomycin 1,000 mg IV Q12 hrs and Ceftriaxone 2 g IV Q24 hrs End date: 02/28/20  IV antibiotic discharge orders are pended. To discharging provider:  please sign these orders via discharge navigator,  Select New Orders & click on the button choice - Manage This Unsigned Work.     Thank you for allowing pharmacy to be a part of this patient's care.  Tama Headings, PharmD PGY1 Pharmacy Resident Phone: [phone] 01/18/2020  12:46 PM  Please check [email or web address] for unit-specific pharmacy phone numbers.

## 2020-01-18 NOTE — Progress Notes (Signed)
Pharmacy Antibiotic Note  Donald Duran is a 69 y.o. male admitted on 01/15/2020 with diabetic foot infection with osteo.  Pharmacy has been consulted for Vancomycin dosing.  Vancomycin peak this PM low at 20. Note missed dose on 1/18 PM. Patient going home tomorrow, 1/20. Will increase from 1g IV q12 to 1250 IV q12 for now. Patient scheduled to have vancomycin trough on Thrusday *which will be a new steady state.   Plan: - Increase Vancomycin 1250g/12h  - abx x 6 weeks - Vancomycin trough Thursday per OPAT orders    Height: 5\' 11"  (180.3 cm) Weight: 177 lb 7.5 oz (80.5 kg) IBW/kg (Calculated) : 75.3  Temp (24hrs), Avg:98.6 F (37 C), Min:98.4 F (36.9 C), Max:99.1 F (37.3 C)  Recent Labs  Lab 01/15/20 0038 01/16/20 0636 01/17/20 0423 01/18/20 1929  WBC 9.9 9.1 7.9  --   CREATININE 1.00 0.97 0.98  --   VANCOPEAK  --   --   --  20*    Estimated Creatinine Clearance: 79 mL/min (by C-G formula based on SCr of 0.98 mg/dL).    No Known Allergies   01/20/20, PharmD, BCPS, BCCCP Clinical Pharmacist Please refer to [facility] for [facility] Pharmacy numbers 01/18/2020 10:05 PM

## 2020-01-18 NOTE — Consult Note (Signed)
[facility] for Infectious Disease    Date of Admission:  01/15/2020     Total days of antibiotics 5               Reason for Consult: Osteomyelitis    Referring Provider: Pokhrel  Primary Care Provider: Wyatt Haste, NP   ASSESSMENT:  Mr. 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 Per Vascular Surgery he has good blood flow to allow for healing. Will start broad spectrum vancomycin and ceftriaxone with no culture data available for review. He will need at least 6 weeks of IV antibiotics for contiguous osteomyelitis. Have notified Home Health regarding plans for outpatient therapy. Will place orders for PICC line. OPAT orders placed below and will follow up in ID office.   PLAN:  1. Start vancomycin and ceftriaxone 2. Place PICC line 3. OPAT placed below. 4. Continue wound care per Vascular Surgery  Diagnosis: Osteomyelitis  Culture Result: None available  No Known Allergies  OPAT Orders Discharge antibiotics: Vancomycin and Ceftriaxone Per pharmacy protocol  Aim for Vancomycin trough 15-20 or AUC 400-550 (unless otherwise indicated) Duration: 6 weeks End Date: 02/28/20  [facility] Care Per Protocol:  Labs weekly while on IV antibiotics: _X_ CBC with differential _X_ BMP (TWICE WEEKLY) __ CMP _X_ CRP _X_ ESR _X_ Vancomycin trough __ CK  _X_ Please pull PIC at completion of IV antibiotics __ Please leave PIC in place until doctor has seen patient or been notified  Fax weekly labs to [phone]  Clinic Follow Up Appt:  02/14/20 at 3:30pm with Dr. Linus Salmons  Principal Problem:   Cellulitis Active Problems:   Type II diabetes mellitus, uncontrolled ([place])   Essential hypertension, benign   Mixed hyperlipidemia   Diabetic foot infection ([place])   Acute osteomyelitis of phalanx of right foot ([place])   . aspirin EC  81 mg Oral Daily  . atorvastatin  20 mg Oral Daily   . enoxaparin (LOVENOX) injection  40 mg Subcutaneous Q24H  . insulin aspart  0-15 Units Subcutaneous TID WC  . insulin aspart  0-5 Units Subcutaneous QHS  . insulin glargine  15 Units Subcutaneous Daily  . lisinopril  20 mg Oral Daily  . sodium chloride flush  3 mL Intravenous Q12H  . sodium chloride flush  3 mL Intravenous Q12H     HPI: Donald Duran is a 69 y.o. male with previous medical history of Type 2 diabetes, hypertension, peripheral vascular disease and s/p [name] admitted to the hospital with cellulitis of the right lower extremity.  Mr. 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 Blisters were treated with basic wound care and he followed up with his primary care provider completing a total of 3 weeks of oral cephalexin. He noted that although his wound did not worsen it also improved very little. Denies any fevers, chills or sweats prior to admission.   X-ray imaging suspicious for osteomyelitis of the fourth and fifth toes with soft tissue ulcer about the first digit. MRI of the right foot with acute osteomyelitis involving the proximal, middle and distal phalanx of the right 4th digit with mildly displaced fractures of the 4th proximal and middle phalanxes; acute osteomyelitis of the great toe distal phalanx and enhancing soft tissue underlying the nail of the fifth digit suggesting infection. Transferred  to [facility] for Vascular Surgery evaluation and intervention. Dr. Carlis Abbott performed an aortogram with no significant flow limiting lesions.  ID was consulted for antibiotic recommendations for osteomyelitis.   Mr. Donald Duran has been afebrile since admission and is POD #1 from aortogram without complication. No microbiology data has been collected to this point. Mr. Donald Duran lives at home with his mother who is a [employer] manager. Denies any allergies or intolerances to medications. He is  not a tobacco smoker. Most recent hemoglobin A1c of 7.0.    Review of Systems: Review of Systems  Constitutional: Negative for chills, fever and weight loss.  Respiratory: Negative for cough, shortness of breath and wheezing.   Cardiovascular: Negative for chest pain and leg swelling.  Gastrointestinal: Negative for abdominal pain, constipation, diarrhea, nausea and vomiting.  Skin: Negative for rash.     Past Medical History:  Diagnosis Date  . Diabetes mellitus without complication ([place])   . Hypertension   . Osteomyelitis ([place])   . Peripheral vascular disease ([place])     Social History   Tobacco Use  . Smoking status: Passive Smoke Exposure - Never Smoker  . Smokeless tobacco: Never Used  Substance Use Topics  . Alcohol use: No  . Drug use: No    Family History  Problem Relation Age of Onset  . Diabetes Mother   . Cancer Father     No Known Allergies  OBJECTIVE: Blood pressure 134/66, pulse 73, temperature 98.4 F (36.9 C), temperature source Oral, resp. rate 16, height '5\' 11"'  (1.803 m), weight 80.5 kg, SpO2 96 %.  Physical Exam Constitutional:      General: He is not in acute distress.    Appearance: He is well-developed. He is not ill-appearing.     Comments: Lying in bed with head of bed elevated; pleasant.   Cardiovascular:     Rate and Rhythm: Normal rate and regular rhythm.     Heart sounds: Normal heart sounds.  Pulmonary:     Effort: Pulmonary effort is normal.     Breath sounds: Normal breath sounds.  Musculoskeletal:     Comments: Ulcers/wounds in various stages of healing. [name].   Skin:    General: Skin is warm and dry.  Neurological:     Mental Status: He is alert and oriented to person, place, and time.  Psychiatric:        Behavior: Behavior normal.        Thought Content: Thought content normal.        Judgment: Judgment normal.     Lab Results Lab Results  Component Value Date   WBC 7.9 01/17/2020   HGB 13.3 01/17/2020   HCT  39.2 01/17/2020   MCV 90.3 01/17/2020   PLT 231 01/17/2020    Lab Results  Component Value Date   CREATININE 0.98 01/17/2020   BUN 17 01/17/2020   NA 140 01/17/2020   K 4.4 01/17/2020   CL 103 01/17/2020   CO2 26 01/17/2020    Lab Results  Component Value Date   ALT 18 01/16/2020   AST 19 01/16/2020   ALKPHOS 49 01/16/2020   BILITOT 1.0 01/16/2020     Microbiology: Recent Results (from the past 240 hour(s))  Respiratory Panel by RT PCR (Flu A&B, Covid) - Nasopharyngeal Swab     Status: None   Collection Time: 01/15/20  1:20 AM   Specimen: Nasopharyngeal Swab  Result Value Ref Range Status   SARS Coronavirus 2 by RT PCR NEGATIVE NEGATIVE Final  Comment: (NOTE) SARS-CoV-2 target nucleic acids are NOT DETECTED. The SARS-CoV-2 RNA is generally detectable in upper respiratoy specimens during the acute phase of infection. The lowest concentration of SARS-CoV-2 viral copies this assay can detect is 131 copies/mL. A negative result does not preclude SARS-Cov-2 infection and should not be used as the sole basis for treatment or other patient management decisions. A negative result may occur with  improper specimen collection/handling, submission of specimen other than nasopharyngeal swab, presence of viral mutation(s) within the areas targeted by this assay, and inadequate number of viral copies (<131 copies/mL). A negative result must be combined with clinical observations, patient history, and epidemiological information. The expected result is Negative. Fact Sheet for Patients:  PinkCheek.be Fact Sheet for Healthcare Providers:  GravelBags.it This test is not yet ap proved or cleared by the [place] FDA and  has been authorized for detection and/or diagnosis of SARS-CoV-2 by FDA under an Emergency Use Authorization (EUA). This EUA will remain  in effect (meaning this test can be used) for the duration of the  COVID-19 declaration under Section 564(b)(1) of the Act, 21 U.S.C. section 360bbb-3(b)(1), unless the authorization is terminated or revoked sooner.    Influenza A by PCR NEGATIVE NEGATIVE Final   Influenza B by PCR NEGATIVE NEGATIVE Final    Comment: (NOTE) The Xpert Xpress SARS-CoV-2/FLU/RSV assay is intended as an aid in  the diagnosis of influenza from Nasopharyngeal swab specimens and  should not be used as a sole basis for treatment. Nasal washings and  aspirates are unacceptable for Xpert Xpress SARS-CoV-2/FLU/RSV  testing. Fact Sheet for Patients: PinkCheek.be Fact Sheet for Healthcare Providers: GravelBags.it This test is not yet approved or cleared by the [place] FDA and  has been authorized for detection and/or diagnosis of SARS-CoV-2 by  FDA under an Emergency Use Authorization (EUA). This EUA will remain  in effect (meaning this test can be used) for the duration of the  Covid-19 declaration under Section 564(b)(1) of the Act, 21  U.S.C. section 360bbb-3(b)(1), unless the authorization is  terminated or revoked. Performed at [facility], [address] [place], [place] [address]   SARS CORONAVIRUS 2 (TAT 6-24 HRS) Nasopharyngeal Nasopharyngeal Swab     Status: None   Collection Time: 01/16/20 11:31 AM   Specimen: Nasopharyngeal Swab  Result Value Ref Range Status   SARS Coronavirus 2 NEGATIVE NEGATIVE Final    Comment: (NOTE) SARS-CoV-2 target nucleic acids are NOT DETECTED. The SARS-CoV-2 RNA is generally detectable in upper and lower respiratory specimens during the acute phase of infection. Negative results do not preclude SARS-CoV-2 infection, do not rule out co-infections with other pathogens, and should not be used as the sole basis for treatment or other patient management decisions. Negative results must be combined with clinical observations, patient history, and epidemiological information.  The expected result is Negative. Fact Sheet for Patients: SugarRoll.be Fact Sheet for Healthcare Providers: https://www.woods-mathews.com/ This test is not yet approved or cleared by the [place] FDA and  has been authorized for detection and/or diagnosis of SARS-CoV-2 by FDA under an Emergency Use Authorization (EUA). This EUA will remain  in effect (meaning this test can be used) for the duration of the COVID-19 declaration under Section 56 4(b)(1) of the Act, 21 U.S.C. section 360bbb-3(b)(1), unless the authorization is terminated or revoked sooner. Performed at [facility], [place] [address], [place], [place] [address]      Terri Piedra, Bernice for Infectious [facility] [phone] Pager  01/18/2020  11:46 AM

## 2020-01-18 NOTE — Progress Notes (Signed)
[facility]  Subjective  -no complaints after arteriogram.   Objective 135/71 75 98.6 F (37 C) (Oral) 19 96%  Intake/Output Summary (Last 24 hours) at 01/18/2020 0812 Last data filed at 01/18/2020 0500 Gross per 24 hour  Intake 1377.08 ml  Output 1050 ml  Net 327.08 ml    Left groin clean dry intact with no hematoma.  [name]. Right DP weakly palpable.  Dry ulcer on the bottom of the right great toe and ulcer between the fourth and fifth toes.  Laboratory Lab Results: Recent Labs    01/16/20 0636 01/17/20 0423  WBC 9.1 7.9  HGB 13.0 13.3  HCT 38.5* 39.2  PLT 220 231   BMET Recent Labs    01/16/20 0636 01/17/20 0423  NA 141 140  K 4.3 4.4  CL 104 103  CO2 28 26  GLUCOSE 130* 134*  BUN 15 17  CREATININE 0.97 0.98  CALCIUM 9.0 9.2    COAG Lab Results  Component Value Date   INR 1.0 01/17/2020   No results found for: PTT  Assessment/Planning:  Postop day 1 status post right lower extremity arteriogram for tissue loss.  As noted yesterday he has inline flow down the anterior tibial and peroneal arteries and should have adequate inflow for healing his toe ulcers.  Discussed he will need 6 weeks of antibiotics given osteomyelitis on MRI.  He will also need good wound care and discussed Betadine paint to the toes with dry gauze between the toes because I think his fourth and fifth toe ulcer is due to likely pressure.  I am hopeful that he can heal these with good wound care and antibiotics.  Certainly discussed if his tissue loss progresses we could debate toe amputations but I do not think he needs that at this time.  I will arrange follow-up with me in about 3 weeks.  Cephus Shelling 01/18/2020 8:12 AM --

## 2020-01-19 LAB — GLUCOSE, CAPILLARY: Glucose-Capillary: 155 mg/dL — ABNORMAL HIGH (ref 70–99)

## 2020-01-19 MED ORDER — METFORMIN HCL 500 MG PO TABS: 500.0000 mg | ORAL_TABLET | Freq: Two times a day (BID) | ORAL | Status: AC

## 2020-01-19 MED ORDER — VANCOMYCIN IV (FOR PTA / DISCHARGE USE ONLY): 1250.0000 mg | Freq: Two times a day (BID) | INTRAVENOUS | 0 refills | Status: AC

## 2020-01-19 NOTE — Discharge Instructions (Signed)
Osteomyelitis, Adult  Bone infections (osteomyelitis) occur when bacteria or other germs get inside a bone. This can happen if you have an infection in another part of your body that spreads through your blood. Germs from your skin or from outside of your body can also cause this type of infection if you have a wound or a broken bone (fracture) that breaks the skin. Bone infections need to be treated quickly to prevent bone damage and to prevent the infection from spreading to other areas of your body. What are the causes? Most bone infections are caused by bacteria. They can also be caused by other germs, such as viruses and funguses. What increases the risk? You are more likely to develop this condition if you:  Recently had surgery, especially bone or joint surgery.  Have a long-term (chronic) disease, such as: ? Diabetes. ? HIV (human immunodeficiency virus). ? Rheumatoid arthritis. ? Sickle cell anemia. ? Kidney disease that requires dialysis.  Are aged 60 years or older.  Have a condition or take medicines that block or weaken your body's defense system (immune system).  Have a condition that reduces your blood flow.  Have an artificial joint.  Have had a joint or bone repaired with plates or screws (surgical hardware).  Use IV drugs.  Have a central line for IV access.  Have had trauma, such as stepping on a nail or a broken bone that came through the skin. What are the signs or symptoms? Symptoms vary depending on the type and location of your infection. Common symptoms of bone infections include:  Fever and chills.  Skin redness and warmth.  Swelling.  Pain and stiffness.  Drainage of fluid or pus near the infection. How is this diagnosed? This condition may be diagnosed based on:  Your symptoms and medical history.  A physical exam.  Tests, such as: ? A sample of tissue, fluid, or blood taken to be examined under a microscope. ? Pus or discharge swabbed  from a wound for testing to identify germs and to determine what type of medicine will kill them (culture and sensitivity). ? Blood tests.  Imaging studies. These may include: ? X-rays. ? MRI. ? CT scan. ? Bone scan. ? Ultrasound. How is this treated? Treatment for this condition depends on the cause and type of infection. Antibiotic medicines are usually the first treatment for a bone infection. This may be done in a hospital at first. You may have to continue IV antibiotics at home or take antibiotics by mouth for several weeks after that. Other treatments may include surgery to remove:  Dead or dying tissue from a bone.  An infected artificial joint.  Infected plates or screws that were used to repair a broken bone. Follow these instructions at home: Medicines   Take over-the-counter and prescription medicines only as told by your health care provider.  Take your antibiotic medicine as told by your health care provider. Do not stop taking the antibiotic even if you start to feel better.  Follow instructions from your health care provider about how to take IV antibiotics at home. You may need to have a nurse come to your home to give you the IV antibiotics. General instructions   Ask your health care provider if you have any restrictions on your activities.  If directed, put ice on the affected area: ? Put ice in a plastic bag. ? Place a towel between your skin and the bag. ? Leave the ice on for 20   minutes, 2-3 times a day.  Wash your hands often with soap and water. If soap and water are not available, use hand sanitizer.  Do not use any products that contain nicotine or tobacco, such as cigarettes and e-cigarettes. These can delay bone healing. If you need help quitting, ask your health care provider.  Keep all follow-up visits as told by your health care provider. This is important. Contact a health care provider if:  You develop a fever or chills.  You have  redness, warmth, pain, or swelling that returns after treatment. Get help right away if:  You have rapid breathing or you have trouble breathing.  You have chest pain.  You cannot drink fluids or make urine.  The affected area swells, changes color, or turns blue.  You have numbness or severe pain in the affected area. Summary  Bone infections (osteomyelitis) occur when bacteria or other germs get inside a bone.  You may be more likely to get this type of infection if you have a condition, such as diabetes, that lowers your ability to fight infection or increases your chances of getting an infection.  Most bone infections are caused by bacteria. They can also be caused by other germs, such as viruses and funguses.  Treatment for this condition usually starts with taking antibiotics. Further treatment depends on the cause and type of infection. This information is not intended to replace advice given to you by your health care provider. Make sure you discuss any questions you have with your health care provider. Document Revised: 01/01/2018 Document Reviewed: 12/25/2017 Elsevier Patient Education  2020 [employer]  

## 2020-01-19 NOTE — Discharge Summary (Signed)
Physician Discharge Summary  Donald Duran FXJ:[id number] DOB: Dec 24, 1951 DOA: 01/15/2020  PCP: Wyatt Haste, NP  Admit date: 01/15/2020 Discharge date: 01/19/2020  Time spent: 45 minutes  Recommendations for Outpatient Follow-up:  Patient will be discharged to home with home health.  Patient will need to follow up with primary care provider within one week of discharge. Follow up with with Dr. Linus Salmons, infectious disease, and Dr. Carlis Abbott, vascular surgery. Patient should continue medications as prescribed.  Patient should follow a heart healthy/carb modified diet.   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 Jani Gravel  Donald Duran  is a 69 y.o. male,  w hypertension, Dm2, PVD, h/o L BKA, presents with c/o cellulitiis > 3 weeks.  Pt has been taking keflex for the past 3 weeks and doesn't seem to be improving and therefore presented to ER for evaluation.   Hospital Course:  Diabetic foot infection/acute myelitis of the right foot -Patient was seen by vascular surgery in the past, Dr. Trula Slade in October 2017 however was lost to follow-up and never had ABIs. -ABI obtained which was unremarkable. -MRI of the foot showed osteomyelitis of the proximal middle and distal phalanx of the right fourth digit with mildly displaced fracture and PIP joint septic arthritis. -Vascular surgery consulted and appreciated, status post abdominal aortogram with lower extremity angiogram on 01/17/2020, no flow-limiting aortoiliac disease.  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  No flow-limiting lesion requiring  intervention. -Vascular surgery recommended outpatient antibiotic with no surgical intervention at this time -Infectious disease consulted and appreciated, patient currently on vancomycin and Rocephin -Patient has received PICC line placement  Essential hypertension -Stable, continue lisinopril  Diabetes mellitus, type II -Patient with hyperglycemia -Hemoglobin A1c of 7 -Continue Lantus, insulin sliding scale -May resume Metformin on discharge  Peripheral vascular disease -Continue aspirin, statin  Hyperlipidemia -Continue statin  Procedures: Abdomial aortogram with LE angiogram on 01/17/20  Consultations: Vascular surgery Infectious disease  Discharge Exam: Vitals:   01/19/20 0400 01/19/20 0740  BP: 131/74 127/78  Pulse: 70 73  Resp: 16 17  Temp: 98.1 F (36.7 C) 98.4 F (36.9 C)  SpO2: 94% 94%     General: Well developed, well nourished, NAD, appears stated age  HEENT: NCAT, mucous membranes moist.  Cardiovascular: S1 S2 auscultated, RRR, no murmur  Respiratory: Clear to auscultation bilaterally with equal chest rise  Abdomen: Soft, nontender, nondistended, + bowel sounds  Extremities:L BKA. Scabs on RLE, dressing in place on R foot  Neuro: AAOx3, nonfocal  Psych: Normal affect and demeanor, pleasant   Discharge Instructions Discharge Instructions    Discharge instructions   Complete by: As directed    Patient will be discharged to home with home health.  Patient will need to follow up with primary care provider within one week of discharge. Follow up with with Dr. Linus Salmons, infectious disease, and Dr. Carlis Abbott, vascular surgery. Patient should continue medications as prescribed.  Patient should follow a heart healthy/carb modified diet.   Home infusion instructions   Complete by: As directed    Instructions: Flushing of vascular access device: 0.9% NaCl pre/post medication administration and prn patency; Heparin 100 u/ml, 62m for  implanted ports and Heparin  10u/ml, 20m for all other central venous catheters.     Allergies as of 01/19/2020   No Known Allergies     Medication List    TAKE these medications   aspirin EC 81 MG tablet Take 81 mg by mouth daily. What changed: Another medication with the same name was removed. Continue taking this medication, and follow the directions you see here.   atorvastatin 20 MG tablet Commonly known as: LIPITOR Take 20 mg by mouth daily.   BASAGLAR KWIKPEN Ewing Inject 40 Units into the skin at bedtime.   cefTRIAXone  IVPB Commonly known as: ROCEPHIN Inject 2 g into the vein daily. Indication:  Osteomyelitis Last Day of Therapy:  02/28/20 Labs - Once weekly:  CBC/D and BMP, Labs - Every other week:  ESR and CRP   CoQ10 100 MG Caps Take 1 capsule by mouth daily.   glucose blood test strip Use as instructed   lisinopril 20 MG tablet Commonly known as: ZESTRIL Take 20 mg by mouth daily.   metFORMIN 500 MG tablet Commonly known as: GLUCOPHAGE Take 1 tablet (500 mg total) by mouth 2 (two) times daily with a meal. Start taking on: January 20, 2020   multivitamin with minerals Tabs tablet Take 1 tablet by mouth daily.   ONE TOUCH LANCETS Misc Check bs as directed   QC TUMERIC COMPLEX PO Take 1 tablet by mouth daily.   vancomycin  IVPB Inject 1,250 mg into the vein every 12 (twelve) hours. Indication:  Osteomyelitis Last Day of Therapy:  02/28/20 Labs - Sunday/Monday:  CBC/D, BMP, and vancomycin trough. Labs - Thursday:  BMP and vancomycin trough Labs - Every other week:  ESR and CRP   vitamin C 100 MG tablet Take 100 mg by mouth daily.            Home Infusion Instuctions  (From admission, onward)         Start     Ordered   01/18/20 0000  Home infusion instructions    Question:  Instructions  Answer:  Flushing of vascular access device: 0.9% NaCl pre/post medication administration and prn patency; Heparin 100 u/ml, 51mfor implanted ports and Heparin 10u/ml, 49m33mor all other  central venous catheters.   01/18/20 1335         No Known Allergies Follow-up Information    [place], RobOkey RegalD Follow up.   Specialty: Infectious Diseases Why: 02/14/20 at 3:30pm. Please call to reschedule if you are unable to make this appointment.  Contact information: [address] [place] [phone]        [facility] Follow up.   Why: home health serivices arranged, RN       ClaMarty HeckD In 3 weeks.   Specialty: Vascular Surgery Why: Office will call you to arrange your appt (sent) Contact information: [phone]        VauWyatt HasteP. Schedule an appointment as soon as possible for a visit in 1 week(s).   Why: Hospital follow up  Contact information: [address] [place] [phone]         The results of significant diagnostics from this hospitalization (including imaging, microbiology, ancillary and laboratory) are listed below for reference.    Significant Diagnostic Studies: MR FOOT RIGHT W WO CONTRAST  Result Date: 01/16/2020 CLINICAL DATA:  Osteomyelitis EXAM: MRI  OF THE RIGHT FOREFOOT WITHOUT AND WITH CONTRAST TECHNIQUE: Multiplanar, multisequence MR imaging of the right forefoot was performed before and after the administration of intravenous contrast. CONTRAST:  33m GADAVIST GADOBUTROL 1 MMOL/ML IV SOLN COMPARISON:  X-ray 01/14/2020 FINDINGS: Bones/Joint/Cartilage Extensive bone marrow edema and enhancement within the fourth digit involving the proximal, middle, and distal phalanx with associated low T1 signal changes compatible with acute osteomyelitis (series 6, images 8-12). There are acute fractures of the proximal and middle phalanx with 9 mm of dorsal displacement at the level of the PIP joint (series 8, image 9). Ill-defined fluid with foci of susceptibility suggesting soft tissue gas centered at the PIP joint compatible with  septic arthritis. Preservation of the normal marrow signal at the fourth metatarsal head. No fourth MTP joint effusion. Marrow edema and enhancement within the great toe distal phalanx with confluent low T1 signal changes at the distal tuft (series 6, images 10-12) compatible with acute osteomyelitis. Abnormal T1 marrow signal does not extend to the great toe IP joint. There is no IP joint effusion. Normal marrow signal within the great toe proximal phalanx. Mild marrow edema without definite abnormal T1 signal in the distal phalanx of the fifth digit. Enhancing soft tissue underlying the nail of the fifth digit suggesting nailbed infection. Scattered degenerative changes most prominent at the first MTP joint where there are associated reactive subchondral marrow changes. Ligaments Intact Lisfranc ligament. Collateral ligaments of the foot appear intact. Muscles and Tendons Diffuse edema like signal throughout the visualized intrinsic foot musculature which may reflect denervation changes and/or myositis. Tendinous structures appear grossly intact. Soft tissues Skin ulceration at the plantar lateral aspect of the fifth digit with extensive edema and enhancement throughout the soft tissues of the fourth digit. There is additional area of skin irregularity at the plantar aspect of the great toe distal phalanx with soft tissue edema and enhancement. Abnormal soft tissue noted under the nail of the fifth digit. IMPRESSION: 1. Acute osteomyelitis involving the proximal, middle, and distal phalanx of the right fourth digit. Mildly displaced fractures of the 4th proximal and middle phalanxes. Ill-defined fluid at the fourth PIP joint suspicious for septic arthritis. 2. Acute osteomyelitis of the great toe distal phalanx. 3. Enhancing soft tissue underlying the nail of the fifth digit suggesting nailbed infection. Mild marrow edema within the fifth digit distal phalanx without corresponding low T1 signal. Findings may  reflect reactive osteitis. Early acute osteomyelitis at this site be difficult to exclude. 4. Skin ulcerations with cellulitis centered at the fourth digit and right toe distal phalanx. 5. Diffuse edema like signal throughout the visualized intrinsic foot musculature which may reflect denervation changes and/or myositis. These results will be called to the ordering clinician or representative by the Radiologist Assistant, and communication documented in the PACS or zVision Dashboard. Electronically Signed   By: NDavina PokeD.O.   On: 01/16/2020 10:34   PERIPHERAL VASCULAR CATHETERIZATION  Result Date: 01/17/2020 Patient name: Donald Duran     MRN: [id number]       DOB: [birth year]           Sex: male  01/17/2020 Pre-operative Diagnosis: Right lower extremity tissue loss Post-operative diagnosis:  Same Surgeon:  CMarty Heck MD Procedure Performed: 1.  Ultrasound-guided access of the left common femoral artery 2.  Aortogram 3.  Bilateral lower extremity arteriogram with runoff 4.  27 minutes of monitored moderate conscious sedation 5.  Mynx closure of the left common femoral artery  Indications: 69 year old male with history of left below-knee amputation as well as diabetes that presented over the weekend with tissue loss of his right lower extremity over the last 4 to 6 weeks.  Ultimately he had noncompressible ABIs with abnormal waveforms at the ankle.  He presented today for planned aortogram, lower extremity arteriogram, and possible intervention after risks and benefits were discussed..  Findings:  Aortogram showed no flow-limiting aortoiliac disease.  Right lower extremity arteriogram showed a widely patent common femoral, profunda, SFA, above and below-knee popliteal artery and two-vessel runoff via a widely patent anterior tibial and peroneal arteries.  His posterior tibial was occluded throughout its course.  He had excellent flow into the foot dominantly through the dorsalis pedis  with even filling of the digital arteries distally in the toes.  No flow-limiting lesions that require intervention.  He should have adequate inline flow for wound healing in the right foot.              Procedure:  The patient was identified in the holding area and taken to room 8.  The patient was then placed supine on the table and prepped and draped in the usual sterile fashion.  A time out was called.  Ultrasound was used to evaluate the left common femoral artery.  It was patent .  A digital ultrasound image was acquired.  A micropuncture needle was used to access the left common femoral artery under ultrasound guidance.  An 018 wire was advanced without resistance and a micropuncture sheath was placed.  The 018 wire was removed and a benson wire was placed.  The micropuncture sheath was exchanged for a 5 french sheath.  An omniflush catheter was advanced over the wire to the level of L-1.  An abdominal angiogram was obtained.  Given that he had some motion artifact on the initial aortogram, we pulled the catheter down and got a right iliac arteriogram.  Given no flow-limiting lesions in the aortoiliac segment, I then crossed the aortic bifurcation into the right external iliac with a Glidewire and Omni Flush.  We advanced our Omni Flush catheter to the distal external iliac.  Right lower extremity runoff was then obtained.  Pertinent findings are noted above, but ultimately he has inline flow throughout the right lower extremity through the common femoral, SFA, popliteal, and two-vessel runoff via the anterior tibial and peroneal arteries.  There is no flow-limiting lesions that require intervention and he should have adequate flow for wound healing.  At that point in time wires and catheters removed.  Mynx closure device was deployed in the left groin.  Plan: Patient should have adequate inline flow for healing of his right toe wounds.  He will need antibiotics as well as good wound care.  Certainly if  his wounds continue to progress we could consider toe amputations if needed.  Marty Heck, MD [facility] Office: [place] Foot Complete Right  Result Date: 01/14/2020 CLINICAL DATA:  Foot wound. Right foot swelling and redness. Wound about the right great toe. Skin breakdown about the fifth toe. History of diabetes. EXAM: RIGHT FOOT COMPLETE - 3+ VIEW COMPARISON:  Slight radiograph 11/27/2019 FINDINGS: Soft tissue defect about the plantar aspect of the great toe. No associated periosteal reaction or abnormal bone density. Skin irregularity with soft tissue air about the distal fifth toe, erosion noted of the fifth toe distal phalanx suspicious for osteomyelitis. Fourth digit partially obscured by overlap  from the fifth toe, lucency through the fourth toe middle phalanx and decreased density of the fourth toe distal phalanx suspicious for osteomyelitis and septic joint. No radiopaque foreign body. Scattered osteoarthritis throughout the foot. Advanced vascular calcifications. IMPRESSION: 1. Findings suspicious for osteomyelitis of the fifth toe distal tuft and fourth toe middle phalanx and proximal interphalangeal joint. Associated soft tissue defect about the distal fifth digit. 2. Soft tissue ulcer about the first digit but no radiographic evidence of osteomyelitis at this site. Electronically Signed   By: Keith Rake M.D.   On: 01/14/2020 19:57   VAS [place] ABI WITH/WO TBI  Result Date: 01/17/2020 LOWER EXTREMITY DOPPLER STUDY Indications: Ulceration, gangrene, peripheral artery disease, and Acute              osteomyelitis of phalanx of right foot and [name]. High Risk Factors: Hypertension, hyperlipidemia, Diabetes.  Comparison Study: No prior study on file Performing Technologist: Sharion Dove RVS  Examination Guidelines: A complete evaluation includes at minimum, Doppler waveform signals and systolic blood pressure reading  at the level of bilateral brachial, anterior tibial, and posterior tibial arteries, when vessel segments are accessible. Bilateral testing is considered an integral part of a complete examination. Photoelectric Plethysmograph (PPG) waveforms and toe systolic pressure readings are included as required and additional duplex testing as needed. Limited examinations for reoccurring indications may be performed as noted.  ABI Findings: +---------+------------------+-----+----------+--------+ Right    Rt Pressure (mmHg)IndexWaveform  Comment  +---------+------------------+-----+----------+--------+ Brachial 128                    triphasic          +---------+------------------+-----+----------+--------+ PTA      234               1.83 biphasic           +---------+------------------+-----+----------+--------+ DP       255               1.99 monophasic         +---------+------------------+-----+----------+--------+ Great Toe94                0.73                    +---------+------------------+-----+----------+--------+ +--------+------------------+-----+---------+-------+ Left    Lt Pressure (mmHg)IndexWaveform Comment +--------+------------------+-----+---------+-------+ [id number]                    triphasic        +--------+------------------+-----+---------+-------+ PTA                                     BKA     +--------+------------------+-----+---------+-------+ DP                                      BKA     +--------+------------------+-----+---------+-------+ +-------+-----------+-----------+------------+------------+ ABI/TBIToday's ABIToday's TBIPrevious ABIPrevious TBI +-------+-----------+-----------+------------+------------+ Right  1.99       0.73                                +-------+-----------+-----------+------------+------------+ Left   BKA                                             +-------+-----------+-----------+------------+------------+  Arterial wall calcification precludes accurate ankle pressures and ABIs.  Summary: Right: Resting right ankle-brachial index indicates noncompressible right lower extremity arteries. The right toe-brachial index is normal. ABIs are unreliable.  *See table(s) above for measurements and observations.  Electronically signed by Ruta Hinds MD on 01/17/2020 at 2:50:00 PM.   Final    [place] EKG SITE RITE  Result Date: 01/18/2020 If Site Rite image not attached, placement could not be confirmed due to current cardiac rhythm.   Microbiology: Recent Results (from the past 240 hour(s))  Respiratory Panel by RT PCR (Flu A&B, Covid) - Nasopharyngeal Swab     Status: None   Collection Time: 01/15/20  1:20 AM   Specimen: Nasopharyngeal Swab  Result Value Ref Range Status   SARS Coronavirus 2 by RT PCR NEGATIVE NEGATIVE Final    Comment: (NOTE) SARS-CoV-2 target nucleic acids are NOT DETECTED. The SARS-CoV-2 RNA is generally detectable in upper respiratoy specimens during the acute phase of infection. The lowest concentration of SARS-CoV-2 viral copies this assay can detect is 131 copies/mL. A negative result does not preclude SARS-Cov-2 infection and should not be used as the sole basis for treatment or other patient management decisions. A negative result may occur with  improper specimen collection/handling, submission of specimen other than nasopharyngeal swab, presence of viral mutation(s) within the areas targeted by this assay, and inadequate number of viral copies (<131 copies/mL). A negative result must be combined with clinical observations, patient history, and epidemiological information. The expected result is Negative. Fact Sheet for Patients:  PinkCheek.be Fact Sheet for Healthcare Providers:  GravelBags.it This test is not yet ap proved or cleared by the [place]  FDA and  has been authorized for detection and/or diagnosis of SARS-CoV-2 by FDA under an Emergency Use Authorization (EUA). This EUA will remain  in effect (meaning this test can be used) for the duration of the COVID-19 declaration under Section 564(b)(1) of the Act, 21 U.S.C. section 360bbb-3(b)(1), unless the authorization is terminated or revoked sooner.    Influenza A by PCR NEGATIVE NEGATIVE Final   Influenza B by PCR NEGATIVE NEGATIVE Final    Comment: (NOTE) The Xpert Xpress SARS-CoV-2/FLU/RSV assay is intended as an aid in  the diagnosis of influenza from Nasopharyngeal swab specimens and  should not be used as a sole basis for treatment. Nasal washings and  aspirates are unacceptable for Xpert Xpress SARS-CoV-2/FLU/RSV  testing. Fact Sheet for Patients: PinkCheek.be Fact Sheet for Healthcare Providers: GravelBags.it This test is not yet approved or cleared by the [place] FDA and  has been authorized for detection and/or diagnosis of SARS-CoV-2 by  FDA under an Emergency Use Authorization (EUA). This EUA will remain  in effect (meaning this test can be used) for the duration of the  Covid-19 declaration under Section 564(b)(1) of the Act, 21  U.S.C. section 360bbb-3(b)(1), unless the authorization is  terminated or revoked. Performed at [facility], [address], [place], [place] [address]   SARS CORONAVIRUS 2 (TAT 6-24 HRS) Nasopharyngeal Nasopharyngeal Swab     Status: None   Collection Time: 01/16/20 11:31 AM   Specimen: Nasopharyngeal Swab  Result Value Ref Range Status   SARS Coronavirus 2 NEGATIVE NEGATIVE Final    Comment: (NOTE) SARS-CoV-2 target nucleic acids are NOT DETECTED. The SARS-CoV-2 RNA is generally detectable in upper and lower respiratory specimens during the acute phase of infection. Negative results do not preclude SARS-CoV-2 infection, do not rule out co-infections with other  pathogens, and should  not be used as the sole basis for treatment or other patient management decisions. Negative results must be combined with clinical observations, patient history, and epidemiological information. The expected result is Negative. Fact Sheet for Patients: SugarRoll.be Fact Sheet for Healthcare Providers: https://www.woods-mathews.com/ This test is not yet approved or cleared by the [place] FDA and  has been authorized for detection and/or diagnosis of SARS-CoV-2 by FDA under an Emergency Use Authorization (EUA). This EUA will remain  in effect (meaning this test can be used) for the duration of the COVID-19 declaration under Section 56 4(b)(1) of the Act, 21 U.S.C. section 360bbb-3(b)(1), unless the authorization is terminated or revoked sooner. Performed at [facility], [address] [place], [place] [address]      Labs: Basic Metabolic Panel: Recent Labs  Lab 01/15/20 0038 01/16/20 0636 01/17/20 0423  NA 136 141 140  K 4.0 4.3 4.4  CL 101 104 103  CO2 '25 28 26  ' GLUCOSE 241* 130* 134*  BUN 31* 15 17  CREATININE 1.00 0.97 0.98  CALCIUM 9.1 9.0 9.2   Liver Function Tests: Recent Labs  Lab 01/16/20 0636  AST 19  ALT 18  ALKPHOS 49  BILITOT 1.0  PROT 6.5  ALBUMIN 3.2*   No results for input(s): LIPASE, AMYLASE in the last 168 hours. No results for input(s): AMMONIA in the last 168 hours. CBC: Recent Labs  Lab 01/15/20 0038 01/16/20 0636 01/17/20 0423  WBC 9.9 9.1 7.9  NEUTROABS 6.0  --   --   HGB 13.0 13.0 13.3  HCT 40.2 38.5* 39.2  MCV 94.4 91.4 90.3  PLT 261 220 231   Cardiac Enzymes: No results for input(s): CKTOTAL, CKMB, CKMBINDEX, TROPONINI in the last 168 hours. BNP: BNP (last 3 results) No results for input(s): BNP in the last 8760 hours.  ProBNP (last 3 results) No results for input(s): PROBNP in the last 8760 hours.  CBG: Recent Labs  Lab 01/18/20 0643  01/18/20 1126 01/18/20 1707 01/18/20 2159 01/19/20 0642  GLUCAP 182* 253* 126* 171* 155*       Signed:  Jarica Plass  [facility] 01/19/2020, 8:29 AM

## 2020-01-19 NOTE — Progress Notes (Signed)
Patient discharged home with home health and advanced therapy for antibiotic home therapy.  Discharge instructions and prescriptions given to patient.  Patient denied any further questions.  Volunteer took patient to car.

## 2020-01-19 NOTE — TOC Progression Note (Signed)
Transition of Care [facility]) - Progression Note    Patient Details  Name: Donald Duran MRN: [id number] Date of Birth: 18-Mar-1951  Transition of Care [facility]) CM/SW Contact  Epifanio Lesches, RN Phone Number: 01/19/2020, 4:07 AM  Clinical Narrative:    Late entry: 01/18/2020 1630 NCM made aware pt will need LT ABX therapy for Osteomyelitis til 02/28/2020. NCM spoke with pt @ bedside regarding HH services. Pt agreeable to [facility] services. Choice offered. Pt without preference. Referral made with [facility] for skilled RN, and [facility] for ABX therapy, both accepted. Order and face to face for [facility] RN will be needed from MD.  Plan is to transition to home, 01/19/2020. TOC will continue monitor for needs...   Expected Discharge Plan: Home w Home Health Services Barriers to Discharge: Continued Medical Work up  Expected Discharge Plan and Services Expected Discharge Plan: Home w Home Health Services   Discharge Planning Services: CM Consult Post Acute Care Choice: Durable Medical Equipment, Home Health                   DME Arranged: IV pump/equipment(iv ABX therapy) DME Agency: Other - Comment([facility]/ [facility]) Date DME Agency Contacted: 01/18/20 Time DME Agency Contacted: 1630 Representative spoke with at DME Agency: Pam HH Arranged: RN HH Agency: [facility] ([facility]) Date HH Agency Contacted: 01/18/20 Time HH Agency Contacted: 1630 Representative spoke with at [facility] Agency: Vikki Ports   Social Determinants of Health (SDOH) Interventions    Readmission Risk Interventions No flowsheet data found.

## 2020-01-20 NOTE — Care Management Important Message (Signed)
Important Message  Patient Details  Name: Donald Duran MRN: [id number] Date of Birth: 08-16-1951   Medicare Important Message Given:  Yes   IM given on 01/19/2020  [name] 01/20/2020, 8:55 AM

## 2020-02-03 ENCOUNTER — Encounter: Payer: Self-pay | Admitting: Internal Medicine

## 2020-02-07 ENCOUNTER — Other Ambulatory Visit (HOSPITAL_COMMUNITY)
Admission: RE | Admit: 2020-02-07 | Discharge: 2020-02-07 | Disposition: A | Discharge status: Home / Self Care | Payer: Medicare Other | Source: Ambulatory Visit | Attending: Family | Admitting: Family

## 2020-02-07 DIAGNOSIS — M869 Osteomyelitis, unspecified: Secondary | ICD-10-CM | POA: Insufficient documentation

## 2020-02-07 LAB — CBC WITH DIFFERENTIAL/PLATELET
Abs Immature Granulocytes: 0.03 10*3/uL (ref 0.00–0.07)
Basophils Absolute: 0.1 10*3/uL (ref 0.0–0.1)
Basophils Relative: 1 %
Eosinophils Absolute: 0.5 10*3/uL (ref 0.0–0.5)
Eosinophils Relative: 7 %
HCT: 41.6 % (ref 39.0–52.0)
Hemoglobin: 13.7 g/dL (ref 13.0–17.0)
Immature Granulocytes: 0 %
Lymphocytes Relative: 23 %
Lymphs Abs: 1.6 10*3/uL (ref 0.7–4.0)
MCH: 30.7 pg (ref 26.0–34.0)
MCHC: 32.9 g/dL (ref 30.0–36.0)
MCV: 93.3 fL (ref 80.0–100.0)
Monocytes Absolute: 0.7 10*3/uL (ref 0.1–1.0)
Monocytes Relative: 10 %
Neutro Abs: 3.9 10*3/uL (ref 1.7–7.7)
Neutrophils Relative %: 59 %
Platelets: 184 10*3/uL (ref 150–400)
RBC: 4.46 MIL/uL (ref 4.22–5.81)
RDW: 13.2 % (ref 11.5–15.5)
WBC: 6.7 10*3/uL (ref 4.0–10.5)
nRBC: 0 % (ref 0.0–0.2)

## 2020-02-07 LAB — BASIC METABOLIC PANEL
Anion gap: 9 (ref 5–15)
BUN: 18 mg/dL (ref 8–23)
CO2: 26 mmol/L (ref 22–32)
Calcium: 8.5 mg/dL — ABNORMAL LOW (ref 8.9–10.3)
Chloride: 104 mmol/L (ref 98–111)
Creatinine, Ser: 0.88 mg/dL (ref 0.61–1.24)
GFR calc Af Amer: >60 mL/min (ref >60)
GFR calc non Af Amer: >60 mL/min (ref >60)
Glucose, Bld: 140 mg/dL — ABNORMAL HIGH (ref 70–99)
Potassium: 4.6 mmol/L (ref 3.5–5.1)
Sodium: 139 mmol/L (ref 135–145)

## 2020-02-07 LAB — SEDIMENTATION RATE: Sed Rate: 17 mm/hr — ABNORMAL HIGH (ref 0–16)

## 2020-02-07 LAB — VANCOMYCIN, TROUGH: Vancomycin Tr: 19 ug/mL (ref 15–20)

## 2020-02-07 LAB — C-REACTIVE PROTEIN: CRP: 2 mg/dL — ABNORMAL HIGH (ref <1.0)

## 2020-02-08 ENCOUNTER — Encounter: Payer: Self-pay | Admitting: Vascular Surgery

## 2020-02-08 ENCOUNTER — Other Ambulatory Visit: Payer: Self-pay

## 2020-02-08 ENCOUNTER — Other Ambulatory Visit (HOSPITAL_COMMUNITY)
Admission: RE | Admit: 2020-02-08 | Discharge: 2020-02-08 | Disposition: A | Discharge status: Home / Self Care | Payer: Medicare Other | Source: Ambulatory Visit | Attending: Vascular Surgery | Admitting: Vascular Surgery

## 2020-02-08 ENCOUNTER — Ambulatory Visit (INDEPENDENT_AMBULATORY_CARE_PROVIDER_SITE_OTHER): Payer: Medicare Other | Admitting: Vascular Surgery

## 2020-02-08 VITALS — BP 126/69 | HR 81 | Temp 97.7°F | Resp 18 | Ht 71.5 in | Wt 189.0 lb

## 2020-02-08 DIAGNOSIS — M86171 Other acute osteomyelitis, right ankle and foot: Secondary | ICD-10-CM

## 2020-02-08 DIAGNOSIS — Z20822 Contact with and (suspected) exposure to covid-19: Secondary | ICD-10-CM | POA: Diagnosis not present

## 2020-02-08 DIAGNOSIS — Z01812 Encounter for preprocedural laboratory examination: Secondary | ICD-10-CM | POA: Diagnosis present

## 2020-02-08 LAB — SARS CORONAVIRUS 2 (TAT 6-24 HRS): SARS Coronavirus 2: NEGATIVE

## 2020-02-08 NOTE — Progress Notes (Signed)
Patient name: Donald Duran MRN: [id number] DOB: 08/02/1951 Sex: male  REASON FOR VISIT: Wound check right lower extremity  HPI: Donald Duran is a 69 y.o. male with history of hypertension and diabetes that presents for follow-up of right lower extremity ulcers.  He was initially seen in the hospital as a transfer from [facility] with tissue loss to the right lower extremity in January 2021.  He had an ulcer on the right great toe as well as ulcer on the right fourth toe.  There was underlying evidence of osteomyelitis on MRI.  Ultimately underwent arteriogram and had two-vessel runoff in the anterior tibial and peroneal with no flow-limiting stenosis and did not require intervention.  He was discharged on IV antibiotics for 6 weeks with Betadine paint to the toes.  He feels the right great toe is improving.  Unfortunately the right fourth toe is looking worse.  He has been putting dry gauze between the toes.  Past Medical History:  Diagnosis Date  . Diabetes mellitus without complication ([place])   . Hypertension   . Osteomyelitis ([place])   . Peripheral vascular disease [facility])     Past Surgical History:  Procedure Laterality Date  . ABDOMINAL AORTOGRAM W/LOWER EXTREMITY Right 01/17/2020   Procedure: ABDOMINAL AORTOGRAM W/LOWER EXTREMITY;  Surgeon: Marty Heck, MD;  Location: [place] [facility];  Service: Cardiovascular;  Laterality: Right;  . LEG AMPUTATION BELOW KNEE Left 2017   Done at [facility] system in [place] by Dr. Shara Blazing  . OPEN TREATMENT ORBITAL FLOOR [place]  . TOE AMPUTATION Left   . TONSILLECTOMY      Family History  Problem Relation Age of Onset  . Diabetes Mother   . Cancer Father     SOCIAL HISTORY: Social History   Tobacco Use  . Smoking status: Passive Smoke Exposure - Never Smoker  . Smokeless tobacco: Never Used  Substance Use Topics  . Alcohol use: No    No Known Allergies  Current Outpatient Medications  Medication  Sig Dispense Refill  . Ascorbic Acid (VITAMIN C) 100 MG tablet Take 100 mg by mouth daily.    Marland Kitchen aspirin EC 81 MG tablet Take 81 mg by mouth daily.    Marland Kitchen atorvastatin (LIPITOR) 20 MG tablet Take 20 mg by mouth daily.    . cefTRIAXone (ROCEPHIN) IVPB Inject 2 g into the vein daily. Indication:  Osteomyelitis Last Day of Therapy:  02/28/20 Labs - Once weekly:  CBC/D and BMP, Labs - Every other week:  ESR and CRP 41 Units 0  . Coenzyme Q10 (COQ10) 100 MG CAPS Take 1 capsule by mouth daily.    Marland Kitchen glucose blood test strip Use as instructed 100 each 1  . Insulin Glargine (BASAGLAR KWIKPEN [place]) Inject 40 Units into the skin at bedtime.     Marland Kitchen lisinopril (ZESTRIL) 20 MG tablet Take 20 mg by mouth daily.    . metFORMIN (GLUCOPHAGE) 500 MG tablet Take 1 tablet (500 mg total) by mouth 2 (two) times daily with a meal.    . Multiple Vitamin (MULTIVITAMIN WITH MINERALS) TABS tablet Take 1 tablet by mouth daily.    . ONE TOUCH LANCETS MISC Check bs as directed 100 each 0  . Turmeric (QC TUMERIC COMPLEX PO) Take 1 tablet by mouth daily.    . vancomycin IVPB Inject 1,250 mg into the vein every 12 (twelve) hours. Indication:  Osteomyelitis Last Day of Therapy:  02/28/20 Labs - Sunday/Monday:  CBC/D,  BMP, and vancomycin trough. Labs - Thursday:  BMP and vancomycin trough Labs - Every other week:  ESR and CRP 80 Units 0   No current facility-administered medications for this visit.    REVIEW OF SYSTEMS:  '[X]'  denotes positive finding, '[ ]'  denotes negative finding Cardiac  Comments:  Chest pain or chest pressure:    Shortness of breath upon exertion:    Short of breath when lying flat:    Irregular heart rhythm:        Vascular    Pain in calf, thigh, or hip brought on by ambulation:    Pain in feet at night that wakes you up from your sleep:     Blood clot in your veins:    Leg swelling:         Pulmonary    Oxygen at home:    Productive cough:     Wheezing:         Neurologic    Sudden weakness in  arms or legs:     Sudden numbness in arms or legs:     Sudden onset of difficulty speaking or slurred speech:    Temporary loss of vision in one eye:     Problems with dizziness:         Gastrointestinal    Blood in stool:     Vomited blood:         Genitourinary    Burning when urinating:     Blood in urine:        Psychiatric    Major depression:         Hematologic    Bleeding problems:    Problems with blood clotting too easily:        Skin    Rashes or ulcers:        Constitutional    Fever or chills:      PHYSICAL EXAM: Vitals:   02/08/20 0822  BP: 126/69  Pulse: 81  Resp: 18  Temp: 97.7 F (36.5 C)  TempSrc: Temporal  SpO2: 100%  Weight: 189 lb (85.7 kg)  Height: 5' 11.5" (1.816 m)    GENERAL: The patient is a well-nourished male, in no acute distress. The vital signs are documented above. CARDIAC: There is a regular rate and rhythm.  VASCULAR:  Right DP 1+ palpable Right great toe ulcer dry, healing Right fourth toe ulcer larger - no frank purulent drainage Previous [name]      DATA:   None  Assessment/Plan:  69 year old male with right great toe and fourth toe ulcers with underlying osteomyelitis on MRI.  Previously underwent arteriogram that showed two-vessel runoff via the anterior tibial and peroneal with no flow-limiting stenosis.  On exam today continues to have a palpable dorsalis pedis pulse ain the right foot and I think he has adequate inflow for wound healing.  He remains on 6 weeks of IV antibiotics with Vanc and ceftriaxone for osteomyelitis noted on MRI.  I think the great toe ulcer is improving.  Unfortunately the right fourth toe looks worse.  I recommended a right fourth toe amputation and we will schedule this for Friday.  Risks and benefits were discussed with the patient.   Marty Heck, MD [facility] [place] Office: [phone]

## 2020-02-10 ENCOUNTER — Encounter (HOSPITAL_COMMUNITY): Payer: Self-pay | Admitting: Vascular Surgery

## 2020-02-10 ENCOUNTER — Other Ambulatory Visit: Payer: Self-pay

## 2020-02-10 NOTE — Progress Notes (Signed)
Spoke with pt for pre-op call. Pt denies cardiac history. Pt is a type 2 Diabetic. Last A1C was 7.0 on 01/15/20. Pt states his fasting blood sugar has been between 98-108. Instructed pt to take 1/2 of his regular dose of Basaglar Insulin this evening. Will take 20 units. Instructed pt to check his blood sugar when he gets up in the AM. If blood sugar is >220 take 1/2 of usual correction dose of Novolog insulin. If blood sugar is 70 or below, treat with 1/2 cup of clear juice (apple or cranberry) and recheck blood sugar 15 minutes after drinking juice. Instructed pt to let his nurse know when he arrives that he had to drink the juice if needed.  Pt had his Covid test done on 02/08/20 and it is negative. Pt states he's been in quarantine since the test was done and understands he needs to stay in quarantine until he comes to the hospital in the AM.

## 2020-02-10 NOTE — Anesthesia Preprocedure Evaluation (Addendum)
Anesthesia Evaluation  Patient identified by MRN, date of birth, ID band Patient awake    Reviewed: Allergy & Precautions, H&P , NPO status , Patient's Chart, lab work & pertinent test results  Airway Mallampati: II  TM Distance: >3 FB Neck ROM: Full    Dental no notable dental hx. (+) Teeth Intact, Dental Advisory Given   Pulmonary neg pulmonary ROS,    Pulmonary exam normal breath sounds clear to auscultation       Cardiovascular Exercise Tolerance: Good hypertension, + Peripheral Vascular Disease  negative cardio ROS Normal cardiovascular exam Rhythm:Regular Rate:Normal     Neuro/Psych negative neurological ROS  negative psych ROS   GI/Hepatic negative GI ROS, Neg liver ROS,   Endo/Other  negative endocrine ROSdiabetes  Renal/GU negative Renal ROS  negative genitourinary   Musculoskeletal negative musculoskeletal ROS (+)   Abdominal   Peds negative pediatric ROS (+)  Hematology negative hematology ROS (+)   Anesthesia Other Findings   Reproductive/Obstetrics negative OB ROS                            Anesthesia Physical Anesthesia Plan  ASA: III  Anesthesia Plan: MAC   Post-op Pain Management:    Induction: Intravenous  PONV Risk Score and Plan: 1  Airway Management Planned: Simple Face Mask, Nasal Cannula, Natural Airway and Mask  Additional Equipment:   Intra-op Plan:   Post-operative Plan:   Informed Consent: I have reviewed the patients History and Physical, chart, labs and discussed the procedure including the risks, benefits and alternatives for the proposed anesthesia with the patient or authorized representative who has indicated his/her understanding and acceptance.       Plan Discussed with: Anesthesiologist  Anesthesia Plan Comments: (  )       Anesthesia Quick Evaluation

## 2020-02-11 ENCOUNTER — Encounter (HOSPITAL_COMMUNITY)
Admission: RE | Disposition: A | Discharge status: Home / Self Care | Payer: Self-pay | Source: Home / Self Care | Attending: Vascular Surgery

## 2020-02-11 ENCOUNTER — Ambulatory Visit (HOSPITAL_COMMUNITY): Payer: Medicare Other | Admitting: Anesthesiology

## 2020-02-11 ENCOUNTER — Ambulatory Visit (HOSPITAL_COMMUNITY)
Admission: RE | Admit: 2020-02-11 | Discharge: 2020-02-11 | Disposition: A | Discharge status: Home / Self Care | Payer: Medicare Other | Attending: Vascular Surgery | Admitting: Vascular Surgery

## 2020-02-11 ENCOUNTER — Encounter (HOSPITAL_COMMUNITY): Payer: Self-pay | Admitting: Vascular Surgery

## 2020-02-11 DIAGNOSIS — M869 Osteomyelitis, unspecified: Secondary | ICD-10-CM | POA: Insufficient documentation

## 2020-02-11 DIAGNOSIS — Z7982 Long term (current) use of aspirin: Secondary | ICD-10-CM | POA: Insufficient documentation

## 2020-02-11 DIAGNOSIS — E1169 Type 2 diabetes mellitus with other specified complication: Secondary | ICD-10-CM | POA: Diagnosis not present

## 2020-02-11 DIAGNOSIS — Z79899 Other long term (current) drug therapy: Secondary | ICD-10-CM | POA: Diagnosis not present

## 2020-02-11 DIAGNOSIS — L97519 Non-pressure chronic ulcer of other part of right foot with unspecified severity: Secondary | ICD-10-CM | POA: Insufficient documentation

## 2020-02-11 DIAGNOSIS — Z794 Long term (current) use of insulin: Secondary | ICD-10-CM | POA: Diagnosis not present

## 2020-02-11 DIAGNOSIS — I10 Essential (primary) hypertension: Secondary | ICD-10-CM | POA: Insufficient documentation

## 2020-02-11 DIAGNOSIS — M86171 Other acute osteomyelitis, right ankle and foot: Secondary | ICD-10-CM

## 2020-02-11 DIAGNOSIS — E1151 Type 2 diabetes mellitus with diabetic peripheral angiopathy without gangrene: Secondary | ICD-10-CM | POA: Insufficient documentation

## 2020-02-11 DIAGNOSIS — Z89512 Acquired absence of left leg below knee: Secondary | ICD-10-CM | POA: Insufficient documentation

## 2020-02-11 DIAGNOSIS — E11621 Type 2 diabetes mellitus with foot ulcer: Secondary | ICD-10-CM | POA: Insufficient documentation

## 2020-02-11 HISTORY — PX: AMPUTATION: SHX166

## 2020-02-11 LAB — GLUCOSE, CAPILLARY
Glucose-Capillary: 150 mg/dL — ABNORMAL HIGH (ref 70–99)
Glucose-Capillary: 150 mg/dL — ABNORMAL HIGH (ref 70–99)

## 2020-02-11 SURGERY — AMPUTATION DIGIT
Anesthesia: Monitor Anesthesia Care | Site: Foot | Laterality: Right

## 2020-02-11 MED ORDER — PHENYLEPHRINE 40 MCG/ML (10ML) SYRINGE FOR IV PUSH (FOR BLOOD PRESSURE SUPPORT)
PREFILLED_SYRINGE | INTRAVENOUS | Status: AC
  Filled 2020-02-11: qty 10

## 2020-02-11 MED ORDER — MIDAZOLAM HCL 2 MG/2ML IJ SOLN
INTRAMUSCULAR | Status: AC
  Filled 2020-02-11: qty 2

## 2020-02-11 MED ORDER — CHLORHEXIDINE GLUCONATE 4 % EX LIQD: 60.0000 mL | Freq: Once | CUTANEOUS | Status: DC

## 2020-02-11 MED ORDER — 0.9 % SODIUM CHLORIDE (POUR BTL) OPTIME
TOPICAL | Status: DC | PRN
  Administered 2020-02-11: 07:00:00 1000 mL

## 2020-02-11 MED ORDER — ACETAMINOPHEN 325 MG PO TABS
ORAL_TABLET | ORAL | Status: AC
  Filled 2020-02-11: qty 2

## 2020-02-11 MED ORDER — ACETAMINOPHEN 160 MG/5ML PO SOLN: 325.0000 mg | ORAL | Status: DC | PRN

## 2020-02-11 MED ORDER — MIDAZOLAM HCL 5 MG/5ML IJ SOLN
INTRAMUSCULAR | Status: DC | PRN
  Administered 2020-02-11: 2 mg via INTRAVENOUS

## 2020-02-11 MED ORDER — FENTANYL CITRATE (PF) 250 MCG/5ML IJ SOLN
INTRAMUSCULAR | Status: AC
  Filled 2020-02-11: qty 5

## 2020-02-11 MED ORDER — PROPOFOL 10 MG/ML IV BOLUS
INTRAVENOUS | Status: DC | PRN
  Administered 2020-02-11: 20 mg via INTRAVENOUS

## 2020-02-11 MED ORDER — LIDOCAINE 2% (20 MG/ML) 5 ML SYRINGE
INTRAMUSCULAR | Status: DC | PRN
  Administered 2020-02-11: 50 mg via INTRAVENOUS

## 2020-02-11 MED ORDER — ACETAMINOPHEN 325 MG PO TABS
325.0000 mg | ORAL_TABLET | ORAL | Status: DC | PRN
  Administered 2020-02-11: 09:00:00 650 mg via ORAL

## 2020-02-11 MED ORDER — FENTANYL CITRATE (PF) 100 MCG/2ML IJ SOLN: 25.0000 ug | INTRAMUSCULAR | Status: DC | PRN

## 2020-02-11 MED ORDER — LIDOCAINE-EPINEPHRINE 1 %-1:100000 IJ SOLN
INTRAMUSCULAR | Status: DC | PRN
  Administered 2020-02-11: 6 mL

## 2020-02-11 MED ORDER — HYDROCODONE-ACETAMINOPHEN 5-325 MG PO TABS: 1.0000 | ORAL_TABLET | Freq: Four times a day (QID) | ORAL | 0 refills | Status: DC | PRN

## 2020-02-11 MED ORDER — ONDANSETRON HCL 4 MG/2ML IJ SOLN: 4.0000 mg | Freq: Once | INTRAMUSCULAR | Status: DC | PRN

## 2020-02-11 MED ORDER — ONDANSETRON HCL 4 MG/2ML IJ SOLN
INTRAMUSCULAR | Status: AC
  Filled 2020-02-11: qty 2

## 2020-02-11 MED ORDER — LACTATED RINGERS IV SOLN: INTRAVENOUS | Status: DC | PRN

## 2020-02-11 MED ORDER — PROPOFOL 10 MG/ML IV BOLUS
INTRAVENOUS | Status: AC
  Filled 2020-02-11: qty 20

## 2020-02-11 MED ORDER — SODIUM CHLORIDE 0.9 % IV SOLN: INTRAVENOUS | Status: DC

## 2020-02-11 MED ORDER — ONDANSETRON HCL 4 MG/2ML IJ SOLN
INTRAMUSCULAR | Status: DC | PRN
  Administered 2020-02-11: 4 mg via INTRAVENOUS

## 2020-02-11 MED ORDER — OXYCODONE HCL 5 MG/5ML PO SOLN: 5.0000 mg | Freq: Once | ORAL | Status: DC | PRN

## 2020-02-11 MED ORDER — LIDOCAINE HCL (PF) 1 % IJ SOLN
INTRAMUSCULAR | Status: AC
  Filled 2020-02-11: qty 30

## 2020-02-11 MED ORDER — LIDOCAINE 2% (20 MG/ML) 5 ML SYRINGE
INTRAMUSCULAR | Status: AC
  Filled 2020-02-11: qty 5

## 2020-02-11 MED ORDER — MEPERIDINE HCL 25 MG/ML IJ SOLN: 6.2500 mg | INTRAMUSCULAR | Status: DC | PRN

## 2020-02-11 MED ORDER — CEFAZOLIN SODIUM-DEXTROSE 2-4 GM/100ML-% IV SOLN
2.0000 g | INTRAVENOUS | Status: AC
  Administered 2020-02-11: 2 g via INTRAVENOUS
  Filled 2020-02-11: qty 100

## 2020-02-11 MED ORDER — LIDOCAINE-EPINEPHRINE 1 %-1:100000 IJ SOLN
INTRAMUSCULAR | Status: AC
  Filled 2020-02-11: qty 2

## 2020-02-11 MED ORDER — OXYCODONE HCL 5 MG PO TABS: 5.0000 mg | ORAL_TABLET | Freq: Once | ORAL | Status: DC | PRN

## 2020-02-11 MED ORDER — PROPOFOL 500 MG/50ML IV EMUL
INTRAVENOUS | Status: DC | PRN
  Administered 2020-02-11: 125 ug/kg/min via INTRAVENOUS

## 2020-02-11 SURGICAL SUPPLY — 30 items
BNDG ELASTIC 4X5.8 VLCR STR LF (GAUZE/BANDAGES/DRESSINGS) ×2 IMPLANT
BNDG GAUZE ELAST 4 BULKY (GAUZE/BANDAGES/DRESSINGS) ×2 IMPLANT
CANISTER SUCT 3000ML PPV (MISCELLANEOUS) ×2 IMPLANT
COVER SURGICAL LIGHT HANDLE (MISCELLANEOUS) ×2 IMPLANT
COVER WAND RF STERILE (DRAPES) ×2 IMPLANT
DRAPE EXTREMITY T 121X128X90 (DISPOSABLE) ×2 IMPLANT
DRAPE HALF SHEET 40X57 (DRAPES) IMPLANT
DRSG EMULSION OIL 3X3 NADH (GAUZE/BANDAGES/DRESSINGS) ×2 IMPLANT
ELECT REM PT RETURN 9FT ADLT (ELECTROSURGICAL) ×2
ELECTRODE REM PT RTRN 9FT ADLT (ELECTROSURGICAL) ×1 IMPLANT
GAUZE SPONGE 4X4 12PLY STRL (GAUZE/BANDAGES/DRESSINGS) ×2 IMPLANT
GAUZE SPONGE 4X4 12PLY STRL LF (GAUZE/BANDAGES/DRESSINGS) ×2 IMPLANT
GLOVE BIO SURGEON STRL SZ7.5 (GLOVE) ×2 IMPLANT
GLOVE BIOGEL PI IND STRL 8 (GLOVE) ×1 IMPLANT
GLOVE BIOGEL PI INDICATOR 8 (GLOVE) ×1
GOWN STRL REUS W/ TWL LRG LVL3 (GOWN DISPOSABLE) ×2 IMPLANT
GOWN STRL REUS W/ TWL XL LVL3 (GOWN DISPOSABLE) ×2 IMPLANT
GOWN STRL REUS W/TWL LRG LVL3 (GOWN DISPOSABLE) ×2
GOWN STRL REUS W/TWL XL LVL3 (GOWN DISPOSABLE) ×2
KIT BASIN OR (CUSTOM PROCEDURE TRAY) ×2 IMPLANT
KIT TURNOVER KIT B (KITS) ×2 IMPLANT
NS IRRIG 1000ML POUR BTL (IV SOLUTION) ×2 IMPLANT
PACK GENERAL/GYN (CUSTOM PROCEDURE TRAY) ×2 IMPLANT
PAD ARMBOARD 7.5X6 YLW CONV (MISCELLANEOUS) ×4 IMPLANT
SUT ETHILON 3 0 PS 1 (SUTURE) ×4 IMPLANT
SUT VIC AB 3-0 SH 27 (SUTURE)
SUT VIC AB 3-0 SH 27X BRD (SUTURE) IMPLANT
TOWEL GREEN STERILE (TOWEL DISPOSABLE) ×4 IMPLANT
UNDERPAD 30X30 (UNDERPADS AND DIAPERS) ×2 IMPLANT
WATER STERILE IRR 1000ML POUR (IV SOLUTION) ×2 IMPLANT

## 2020-02-11 NOTE — Transfer of Care (Signed)
Immediate Anesthesia Transfer of Care Note  Patient: Donald Duran  Procedure(s) Performed: AMPUTATION FOURTH TOE (Right Foot)  Patient Location: PACU  Anesthesia Type:MAC  Level of Consciousness: awake, alert  and patient cooperative  Airway & Oxygen Therapy: Patient Spontanous Breathing  Post-op Assessment: Report given to RN and Post -op Vital signs reviewed and stable  Post vital signs: Reviewed and stable  Last Vitals:  Vitals Value Taken Time  BP 93/60 02/11/20 0820  Temp    Pulse 84 02/11/20 0820  Resp 14 02/11/20 0820  SpO2 95 % 02/11/20 0820  Vitals shown include unvalidated device data.  Last Pain:  Vitals:   02/11/20 0648  PainSc: 5       Patients Stated Pain Goal: 2 (48/47/20 7218)  Complications: No apparent anesthesia complications

## 2020-02-11 NOTE — Discharge Instructions (Signed)
Keep incision dry. Elevate right leg as much as possible.

## 2020-02-11 NOTE — Anesthesia Postprocedure Evaluation (Signed)
Anesthesia Post Note  Patient: Aadil Sur  Procedure(s) Performed: AMPUTATION FOURTH TOE (Right Foot)     Patient location during evaluation: PACU Anesthesia Type: MAC Level of consciousness: awake and alert Pain management: pain level controlled Vital Signs Assessment: post-procedure vital signs reviewed and stable Respiratory status: spontaneous breathing, nonlabored ventilation, respiratory function stable and patient connected to nasal cannula oxygen Cardiovascular status: stable and blood pressure returned to baseline Postop Assessment: no apparent nausea or vomiting Anesthetic complications: no    Last Vitals:  Vitals:   02/11/20 0835 02/11/20 0850  BP: 111/66 117/60  Pulse: 84 78  Resp: 20 17  Temp:    SpO2: 96% 95%    Last Pain:  Vitals:   02/11/20 0850  PainSc: 3                  [place]

## 2020-02-11 NOTE — H&P (Signed)
History and Physical Interval Note:  02/11/2020 7:17 AM  Donald Duran  has presented today for surgery, with the diagnosis of NON VIABLE TISSUE.  The various methods of treatment have been discussed with the patient and family. After consideration of risks, benefits and other options for treatment, the patient has consented to  Procedure(s): AMPUTATION DIGIT (Right) as a surgical intervention.  The patient's history has been reviewed, patient examined, no change in status, stable for surgery.  I have reviewed the patient's chart and labs.  Questions were answered to the patient's satisfaction.    Right 4th toe amputation  Marty Heck  Patient name: Donald Duran MRN: [id number] DOB: February 12, 1951 Sex: male  REASON FOR VISIT: Wound check right lower extremity  HPI:  Donald Duran is a 69 y.o. male with history of hypertension and diabetes that presents for follow-up of right lower extremity ulcers. He was initially seen in the hospital as a transfer from [facility] with tissue loss to the right lower extremity in January 2021. He had an ulcer on the right great toe as well as ulcer on the right fourth toe. There was underlying evidence of osteomyelitis on MRI. Ultimately underwent arteriogram and had two-vessel runoff in the anterior tibial and peroneal with no flow-limiting stenosis and did not require intervention. He was discharged on IV antibiotics for 6 weeks with Betadine paint to the toes. He feels the right great toe is improving. Unfortunately the right fourth toe is looking worse. He has been putting dry gauze between the toes.      Past Medical History:  Diagnosis Date  . Diabetes mellitus without complication ([place])   . Hypertension   . Osteomyelitis ([place])   . Peripheral vascular disease [facility])         Past Surgical History:  Procedure Laterality Date  . ABDOMINAL AORTOGRAM W/LOWER EXTREMITY Right 01/17/2020   Procedure: ABDOMINAL AORTOGRAM W/LOWER EXTREMITY;  Surgeon: Marty Heck, MD; Location: [place] [facility]; Service: Cardiovascular; Laterality: Right;  . LEG AMPUTATION BELOW KNEE Left 2017   Done at [facility] system in [place] by Dr. Shara Blazing  . OPEN TREATMENT ORBITAL FLOOR [place]  . TOE AMPUTATION Left   . TONSILLECTOMY          Family History  Problem Relation Age of Onset  . Diabetes Mother   . Cancer Father    SOCIAL HISTORY:  Social History       Tobacco Use  . Smoking status: Passive Smoke Exposure - Never Smoker  . Smokeless tobacco: Never Used  Substance Use Topics  . Alcohol use: No   No Known Allergies        Current Outpatient Medications  Medication Sig Dispense Refill  . Ascorbic Acid (VITAMIN C) 100 MG tablet Take 100 mg by mouth daily.    Marland Kitchen aspirin EC 81 MG tablet Take 81 mg by mouth daily.    Marland Kitchen atorvastatin (LIPITOR) 20 MG tablet Take 20 mg by mouth daily.    . cefTRIAXone (ROCEPHIN) IVPB Inject 2 g into the vein daily. Indication: Osteomyelitis  Last Day of Therapy: 02/28/20  Labs - Once weekly: CBC/D and BMP,  Labs - Every other week: ESR and CRP 41 Units 0  . Coenzyme Q10 (COQ10) 100 MG CAPS Take 1 capsule by mouth daily.    Marland Kitchen glucose blood test strip Use as instructed 100 each 1  . Insulin Glargine (BASAGLAR KWIKPEN [place]) Inject 40 Units into the skin at  bedtime.     Marland Kitchen lisinopril (ZESTRIL) 20 MG tablet Take 20 mg by mouth daily.    . metFORMIN (GLUCOPHAGE) 500 MG tablet Take 1 tablet (500 mg total) by mouth 2 (two) times daily with a meal.    . Multiple Vitamin (MULTIVITAMIN WITH MINERALS) TABS tablet Take 1 tablet by mouth daily.    . ONE TOUCH LANCETS MISC Check bs as directed 100 each 0  . Turmeric (QC TUMERIC COMPLEX PO) Take 1 tablet by mouth daily.    . vancomycin IVPB Inject 1,250 mg into the vein every 12 (twelve) hours. Indication: Osteomyelitis  Last Day of Therapy: 02/28/20  Labs - Sunday/Monday: CBC/D, BMP, and vancomycin trough.  Labs - Thursday: BMP and  vancomycin trough  Labs - Every other week: ESR and CRP 80 Units 0   No current facility-administered medications for this visit.   REVIEW OF SYSTEMS:  '[X]'  denotes positive finding, '[ ]'  denotes negative finding  Cardiac  Comments:  Chest pain or chest pressure:    Shortness of breath upon exertion:    Short of breath when lying flat:    Irregular heart rhythm:        Vascular    Pain in calf, thigh, or hip brought on by ambulation:    Pain in feet at night that wakes you up from your sleep:     Blood clot in your veins:    Leg swelling:         Pulmonary    Oxygen at home:    Productive cough:     Wheezing:         Neurologic    Sudden weakness in arms or legs:     Sudden numbness in arms or legs:     Sudden onset of difficulty speaking or slurred speech:    Temporary loss of vision in one eye:     Problems with dizziness:         Gastrointestinal    Blood in stool:     Vomited blood:         Genitourinary    Burning when urinating:     Blood in urine:        Psychiatric    Major depression:         Hematologic    Bleeding problems:    Problems with blood clotting too easily:        Skin    Rashes or ulcers:        Constitutional    Fever or chills:    PHYSICAL EXAM:     Vitals:   02/08/20 0822  BP: 126/69  Pulse: 81  Resp: 18  Temp: 97.7 F (36.5 C)  TempSrc: Temporal  SpO2: 100%  Weight: 189 lb (85.7 kg)  Height: 5' 11.5" (1.816 m)   GENERAL: The patient is a well-nourished male, in no acute distress. The vital signs are documented above.  CARDIAC: There is a regular rate and rhythm.  VASCULAR:  Right DP 1+ palpable  Right great toe ulcer dry, healing  Right fourth toe ulcer larger - no frank purulent drainage  Previous [name]   DATA:  None  Assessment/Plan:  69 year old male with right great toe and fourth toe ulcers with underlying osteomyelitis on MRI. Previously underwent arteriogram that showed two-vessel runoff via the anterior  tibial and peroneal with no flow-limiting stenosis. On exam today continues to have a palpable dorsalis pedis pulse ain the right foot and I think he  has adequate inflow for wound healing. He remains on 6 weeks of IV antibiotics with Vanc and ceftriaxone for osteomyelitis noted on MRI. I think the great toe ulcer is improving. Unfortunately the right fourth toe looks worse. I recommended a right fourth toe amputation and we will schedule this for Friday. Risks and benefits were discussed with the patient.  Marty Heck, MD  [facility] [place]  Office: [phone]

## 2020-02-11 NOTE — Op Note (Signed)
Date: February 11, 2020  Preoperative diagnosis: Necrotic ulceration on right fourth toe (non-healing)   Postoperative diagnosis: Same  Procedure: Right fourth toe amputation  Surgeon: Dr. Cephus Shelling, MD  Assistant: Or staff  Indication: 69 year old male that was seen for right lower extremity tissue loss.  Ultimately underwent right lower extremity arteriogram with no flow-limiting stenosis in large vessels.  He presents today for right fourth toe amputation after progression of a large ulcer and non-healing wound on the toe.  Risks and benefits were discussed with the patient.  Findings: Healthy tissue base on the base of the right fourth toe after amputation.  Details: Patient was taken to the operating room after informed consent was obtained.  Placed on operative table in supine position.  After anesthesia was induced his right foot was prepped and draped in standard sterile fashion.  Timeout was performed.  Initially used 1% lidocaine with epinephrine and did a digital block of the right fourth toe with approximately 6 cc of local.  I then made a fishmouth incision at the base of the fourth toe with 15 blade scalpel.  Dissected down and ultimately used a rongeur to take phalanges back to healthy bone.  At that point time there was good bleeding.  The wound was irrigated.  This was closed loosely with 3-0 nylons.  Dry sterile dressings were applied.  Complication: None  Condition: Stable  Cephus Shelling, MD [facility] [place] Office: [phone]   Cephus Shelling

## 2020-02-11 NOTE — Anesthesia Procedure Notes (Signed)
Procedure Name: MAC Date/Time: 02/11/2020 7:40 AM Performed by: Inda Coke, CRNA Pre-anesthesia Checklist: Patient identified, Emergency Drugs available, Suction available, Timeout performed and Patient being monitored Patient Re-evaluated:Patient Re-evaluated prior to induction Oxygen Delivery Method: Simple face mask Induction Type: IV induction Dental Injury: Teeth and Oropharynx as per pre-operative assessment

## 2020-02-14 ENCOUNTER — Encounter: Payer: Self-pay | Admitting: Internal Medicine

## 2020-02-14 ENCOUNTER — Other Ambulatory Visit: Payer: Self-pay

## 2020-02-14 ENCOUNTER — Ambulatory Visit (INDEPENDENT_AMBULATORY_CARE_PROVIDER_SITE_OTHER): Payer: Medicare Other | Admitting: Internal Medicine

## 2020-02-14 VITALS — BP 142/82 | HR 82 | Temp 97.6°F | Ht 71.5 in | Wt 194.0 lb

## 2020-02-14 DIAGNOSIS — M86171 Other acute osteomyelitis, right ankle and foot: Secondary | ICD-10-CM | POA: Diagnosis present

## 2020-02-14 DIAGNOSIS — Z5181 Encounter for therapeutic drug level monitoring: Secondary | ICD-10-CM

## 2020-02-14 DIAGNOSIS — Z452 Encounter for adjustment and management of vascular access device: Secondary | ICD-10-CM | POA: Diagnosis not present

## 2020-02-14 NOTE — Progress Notes (Signed)
Notified [facility] [phone] order pick line out, last antibiotic March 1.

## 2020-02-15 ENCOUNTER — Encounter: Payer: Self-pay | Admitting: Internal Medicine

## 2020-02-15 DIAGNOSIS — Z5181 Encounter for therapeutic drug level monitoring: Secondary | ICD-10-CM | POA: Insufficient documentation

## 2020-02-15 DIAGNOSIS — Z452 Encounter for adjustment and management of vascular access device: Secondary | ICD-10-CM | POA: Insufficient documentation

## 2020-02-15 NOTE — Progress Notes (Signed)
   Subjective:    Patient ID: Donald Duran, male    DOB: Jan 13, 1951, 69 y.o.   MRN: [id number]  HPI Here for hsfu He has osteomyelitis of the 1st and 4th phalanges with critical limb ischemia s/p aortogram with good resultant blood flow.  Unforunately, since he left the hospital, he had to undergo fourth toe amputation due to destruction of his bone.  He was placed on empiric vancomycin and ceftriaxone and blood cultures remain negative.  He is projected to get this for 6 weeks through March 1.  His lab results through last week include a normal creatinine and a sed rate that is down to just 17 and a stable CRP at just 2.  He feels well and has had no issues with the antibiotics including no associated rash or diarrhea.   Review of Systems  Constitutional: Negative for chills, fever and unexpected weight change.  Gastrointestinal: Negative for diarrhea and nausea.  Skin: Negative for rash.       Objective:   Physical Exam Constitutional:      Appearance: Normal appearance.  Eyes:     General: No scleral icterus. Neurological:     General: No focal deficit present.     Mental Status: He is alert.  Psychiatric:        Mood and Affect: Mood normal.   SH: no tobacco       Assessment & Plan:

## 2020-02-15 NOTE — Assessment & Plan Note (Signed)
This is doing well with no issues and can be removed by home health after his last dose on 02/28/20.

## 2020-02-15 NOTE — Assessment & Plan Note (Signed)
He is doing well, inflammatory markers improved to near normal.  Will continue with the plan for completion of IV antibiotics through March 1st and stop.  He will return if needed and has follow up with surgery after treatment completion.

## 2020-02-15 NOTE — Assessment & Plan Note (Signed)
His creat has remained wnl and will continue to monitor while on treatment

## 2020-02-15 NOTE — Assessment & Plan Note (Signed)
His vancomycin level has been good, last was 19 with goal 15-20.

## 2020-03-07 ENCOUNTER — Ambulatory Visit (INDEPENDENT_AMBULATORY_CARE_PROVIDER_SITE_OTHER): Payer: Medicare Other | Admitting: Physician Assistant

## 2020-03-07 ENCOUNTER — Other Ambulatory Visit: Payer: Self-pay

## 2020-03-07 ENCOUNTER — Encounter: Payer: Self-pay | Admitting: Vascular Surgery

## 2020-03-07 DIAGNOSIS — S98131A Complete traumatic amputation of one right lesser toe, initial encounter: Secondary | ICD-10-CM

## 2020-03-07 NOTE — Progress Notes (Signed)
    Postoperative Visit    History of Present Illness   Donald Duran is a 69 y.o. male who presents for postoperative follow-up for: right Fourth toe amputation by Dr. Chestine Spore on 02/11/2020.  The patient states toe amputation site is well-healed.  He also states that right great toe ulceration is half the size.  He completed his IV antibiotics for diagnosed osteomyelitis and is scheduled to have his PICC line removed.  Aortogram performed prior to to amputation demonstrated inline flow into the DP artery of his right foot and patient also has a patent peroneal artery with posterior tibial artery being occluded.  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  Patient also needs a prescription for silicone locking liners and sock for [name] and prosthetic.   For VQI Use Only   PRE-ADM LIVING: Home  AMB STATUS: Ambulatory   Physical Examination   Vitals:   03/07/20 1516  BP: 126/71  Pulse: 81  Resp: 20  Temp: 97.8 F (36.6 C)  SpO2: 96%    RLE: Right fourth toe amputation site well-healed; right great toe ulcer is dry, shallow, no sign of infection; 1+ right DP pulse   Medical Decision Making   Donald Duran is a 69 y.o. male who presents 3 to 4 weeks s/p right fourth toe amputation   Fourth toe amputation site healed; sutures removed  Continue current wound care for right great toe ulceration; avoid pressure when possible  Check ABIs in 6 months  Call/return office sooner if right great toe ulceration worsens  Emilie Rutter PA-C [facility] Office: [phone]  Clinic MD: Chestine Spore

## 2020-03-08 ENCOUNTER — Other Ambulatory Visit: Payer: Self-pay | Admitting: *Deleted

## 2020-03-08 DIAGNOSIS — S98131A Complete traumatic amputation of one right lesser toe, initial encounter: Secondary | ICD-10-CM

## 2020-05-04 ENCOUNTER — Encounter (HOSPITAL_COMMUNITY): Payer: Self-pay | Admitting: Emergency Medicine

## 2020-05-04 ENCOUNTER — Inpatient Hospital Stay (HOSPITAL_COMMUNITY)
Admission: EM | Admit: 2020-05-04 | Discharge: 2020-05-06 | DRG: 617 | Disposition: A | Discharge status: Home / Self Care | Payer: Medicare Other | Attending: Internal Medicine | Admitting: Internal Medicine

## 2020-05-04 ENCOUNTER — Emergency Department (HOSPITAL_COMMUNITY): Payer: Medicare Other

## 2020-05-04 ENCOUNTER — Other Ambulatory Visit: Payer: Self-pay

## 2020-05-04 DIAGNOSIS — L089 Local infection of the skin and subcutaneous tissue, unspecified: Secondary | ICD-10-CM | POA: Diagnosis present

## 2020-05-04 DIAGNOSIS — L97519 Non-pressure chronic ulcer of other part of right foot with unspecified severity: Secondary | ICD-10-CM | POA: Diagnosis present

## 2020-05-04 DIAGNOSIS — Z89421 Acquired absence of other right toe(s): Secondary | ICD-10-CM | POA: Diagnosis not present

## 2020-05-04 DIAGNOSIS — M86171 Other acute osteomyelitis, right ankle and foot: Secondary | ICD-10-CM | POA: Diagnosis present

## 2020-05-04 DIAGNOSIS — E1169 Type 2 diabetes mellitus with other specified complication: Principal | ICD-10-CM | POA: Diagnosis present

## 2020-05-04 DIAGNOSIS — Z20822 Contact with and (suspected) exposure to covid-19: Secondary | ICD-10-CM | POA: Diagnosis present

## 2020-05-04 DIAGNOSIS — E1165 Type 2 diabetes mellitus with hyperglycemia: Secondary | ICD-10-CM | POA: Diagnosis present

## 2020-05-04 DIAGNOSIS — Z87891 Personal history of nicotine dependence: Secondary | ICD-10-CM

## 2020-05-04 DIAGNOSIS — IMO0002 Reserved for concepts with insufficient information to code with codable children: Secondary | ICD-10-CM | POA: Diagnosis present

## 2020-05-04 DIAGNOSIS — M86671 Other chronic osteomyelitis, right ankle and foot: Secondary | ICD-10-CM | POA: Diagnosis not present

## 2020-05-04 DIAGNOSIS — E11628 Type 2 diabetes mellitus with other skin complications: Secondary | ICD-10-CM | POA: Diagnosis present

## 2020-05-04 DIAGNOSIS — E782 Mixed hyperlipidemia: Secondary | ICD-10-CM | POA: Diagnosis present

## 2020-05-04 DIAGNOSIS — Z794 Long term (current) use of insulin: Secondary | ICD-10-CM | POA: Diagnosis not present

## 2020-05-04 DIAGNOSIS — Z792 Long term (current) use of antibiotics: Secondary | ICD-10-CM

## 2020-05-04 DIAGNOSIS — E1151 Type 2 diabetes mellitus with diabetic peripheral angiopathy without gangrene: Secondary | ICD-10-CM | POA: Diagnosis present

## 2020-05-04 DIAGNOSIS — Z7982 Long term (current) use of aspirin: Secondary | ICD-10-CM | POA: Diagnosis not present

## 2020-05-04 DIAGNOSIS — Z89512 Acquired absence of left leg below knee: Secondary | ICD-10-CM

## 2020-05-04 DIAGNOSIS — I10 Essential (primary) hypertension: Secondary | ICD-10-CM | POA: Diagnosis present

## 2020-05-04 DIAGNOSIS — M869 Osteomyelitis, unspecified: Secondary | ICD-10-CM | POA: Diagnosis present

## 2020-05-04 DIAGNOSIS — E11621 Type 2 diabetes mellitus with foot ulcer: Secondary | ICD-10-CM | POA: Diagnosis present

## 2020-05-04 DIAGNOSIS — Z833 Family history of diabetes mellitus: Secondary | ICD-10-CM | POA: Diagnosis not present

## 2020-05-04 DIAGNOSIS — Z79899 Other long term (current) drug therapy: Secondary | ICD-10-CM

## 2020-05-04 LAB — CBC WITH DIFFERENTIAL/PLATELET
Abs Immature Granulocytes: 0.02 10*3/uL (ref 0.00–0.07)
Basophils Absolute: 0.1 10*3/uL (ref 0.0–0.1)
Basophils Relative: 1 %
Eosinophils Absolute: 0.3 10*3/uL (ref 0.0–0.5)
Eosinophils Relative: 4 %
HCT: 43.2 % (ref 39.0–52.0)
Hemoglobin: 14.1 g/dL (ref 13.0–17.0)
Immature Granulocytes: 0 %
Lymphocytes Relative: 19 %
Lymphs Abs: 1.6 10*3/uL (ref 0.7–4.0)
MCH: 29.6 pg (ref 26.0–34.0)
MCHC: 32.6 g/dL (ref 30.0–36.0)
MCV: 90.8 fL (ref 80.0–100.0)
Monocytes Absolute: 0.7 10*3/uL (ref 0.1–1.0)
Monocytes Relative: 8 %
Neutro Abs: 5.8 10*3/uL (ref 1.7–7.7)
Neutrophils Relative %: 68 %
Platelets: 182 10*3/uL (ref 150–400)
RBC: 4.76 MIL/uL (ref 4.22–5.81)
RDW: 13.8 % (ref 11.5–15.5)
WBC: 8.5 10*3/uL (ref 4.0–10.5)
nRBC: 0 % (ref 0.0–0.2)

## 2020-05-04 LAB — COMPREHENSIVE METABOLIC PANEL WITH GFR
ALT: 31 U/L (ref 0–44)
AST: 28 U/L (ref 15–41)
Albumin: 4.4 g/dL (ref 3.5–5.0)
Alkaline Phosphatase: 45 U/L (ref 38–126)
Anion gap: 9 (ref 5–15)
BUN: 22 mg/dL (ref 8–23)
CO2: 25 mmol/L (ref 22–32)
Calcium: 8.8 mg/dL — ABNORMAL LOW (ref 8.9–10.3)
Chloride: 102 mmol/L (ref 98–111)
Creatinine, Ser: 0.95 mg/dL (ref 0.61–1.24)
GFR calc Af Amer: >60 mL/min
GFR calc non Af Amer: >60 mL/min
Glucose, Bld: 180 mg/dL — ABNORMAL HIGH (ref 70–99)
Potassium: 4.2 mmol/L (ref 3.5–5.1)
Sodium: 136 mmol/L (ref 135–145)
Total Bilirubin: 1.3 mg/dL — ABNORMAL HIGH (ref 0.3–1.2)
Total Protein: 7.4 g/dL (ref 6.5–8.1)

## 2020-05-04 LAB — RESPIRATORY PANEL BY RT PCR (FLU A&B, COVID)
Influenza A by PCR: NEGATIVE
Influenza B by PCR: NEGATIVE
SARS Coronavirus 2 by RT PCR: NEGATIVE

## 2020-05-04 LAB — CBG MONITORING, ED: Glucose-Capillary: 131 mg/dL — ABNORMAL HIGH (ref 70–99)

## 2020-05-04 LAB — C-REACTIVE PROTEIN: CRP: 1.4 mg/dL — ABNORMAL HIGH (ref <1.0)

## 2020-05-04 MED ORDER — VANCOMYCIN HCL 2000 MG/400ML IV SOLN
2000.0000 mg | Freq: Once | INTRAVENOUS | Status: AC
  Administered 2020-05-04: 2000 mg via INTRAVENOUS
  Filled 2020-05-04: qty 400

## 2020-05-04 MED ORDER — SODIUM CHLORIDE 0.9 % IV SOLN
2.0000 g | Freq: Once | INTRAVENOUS | Status: AC
  Administered 2020-05-04: 2 g via INTRAVENOUS
  Filled 2020-05-04: qty 2

## 2020-05-04 MED ORDER — VANCOMYCIN HCL IN DEXTROSE 1-5 GM/200ML-% IV SOLN: 1000.0000 mg | Freq: Two times a day (BID) | INTRAVENOUS | Status: DC

## 2020-05-04 MED ORDER — VANCOMYCIN HCL IN DEXTROSE 1-5 GM/200ML-% IV SOLN: 1000.0000 mg | Freq: Once | INTRAVENOUS | Status: DC

## 2020-05-04 MED ORDER — INSULIN ASPART 100 UNIT/ML ~~LOC~~ SOLN
0.0000 [IU] | SUBCUTANEOUS | Status: DC
  Administered 2020-05-04 – 2020-05-05 (×2): 2 [IU] via SUBCUTANEOUS
  Administered 2020-05-05 (×2): 3 [IU] via SUBCUTANEOUS
  Administered 2020-05-05: 2 [IU] via SUBCUTANEOUS
  Administered 2020-05-06: 3 [IU] via SUBCUTANEOUS
  Administered 2020-05-06: 2 [IU] via SUBCUTANEOUS
  Administered 2020-05-06: 3 [IU] via SUBCUTANEOUS
  Filled 2020-05-04: qty 1

## 2020-05-04 MED ORDER — SODIUM CHLORIDE 0.9 % IV SOLN: 2.0000 g | Freq: Three times a day (TID) | INTRAVENOUS | Status: DC

## 2020-05-04 NOTE — H&P (Addendum)
History and Physical    Donald Duran IAX:[id number] DOB: April 24, 1951 DOA: 05/04/2020  PCP: Gwenlyn Saran [place]   Patient coming from: Home  I have personally briefly reviewed patient's old medical records in [place]  Chief Complaint: Right toe wound  HPI: Donald Duran is a 69 y.o. male with medical history significant for  HTN, DM, peripheral vascular disease, [name], osteomyelitis.  Patient had osteomyelitis involving the same foot-medial and distal phalange of fourth toe, in January 2021, had PICC line placed and was fitted with vancomycin and Rocephin, followed up as an outpatient with vascular surgery, and had right fourth toe amputation, by Dr. Carlis Abbott 01/2020, and antibiotics continued through 02/28/2020. He has had this ulcer to the same right foot but this time involving his 1st and 5th toe, for at least 2 months.  As the wound was not healing, he was prescribed clindamycin about 2 weeks ago by the wound care center surgeon.  Followed up with his primary care provider and had x-rays and yesterday had subsequent MRI done, which showed findings consistent with osteomyelitis.  He was referred to the ED. He denies fevers or chills.  No vomiting no loose stools.  ED Course: Stable vitals.  Temperature 98.  WBC 8.5.  Right foot in the ED shows osteomyelitis of fifth and fifth digits-refer to MRI 5/5 for complete description of findings.  Patient was started on IV vancomycin and cefepime. EDP talked to vascular surgeon on-call Dr. Donzetta Matters, recommended admission to [facility], will see in consult.  MRI read below.       Review of Systems: As per HPI all other systems reviewed and negative.  Past Medical History:  Diagnosis Date  . Diabetes mellitus without complication ([place])   . Hypertension   . Osteomyelitis ([place])   . Peripheral vascular disease [facility])     Past Surgical History:  Procedure Laterality Date  . ABDOMINAL AORTOGRAM W/LOWER EXTREMITY Right  01/17/2020   Procedure: ABDOMINAL AORTOGRAM W/LOWER EXTREMITY;  Surgeon: Marty Heck, MD;  Location: [place] CV LAB;  Service: Cardiovascular;  Laterality: Right;  . AMPUTATION Right 02/11/2020   Procedure: AMPUTATION FOURTH TOE;  Surgeon: Marty Heck, MD;  Location: [place];  Service: Vascular;  Laterality: Right;  . LEG AMPUTATION BELOW KNEE Left 2017   Done at [facility] in [place] by Dr. Shara Blazing  . OPEN TREATMENT ORBITAL FLOOR [place]  . TOE AMPUTATION Left   . TONSILLECTOMY       reports that he has quit smoking. He has never used smokeless tobacco. He reports that he does not drink alcohol or use drugs.  No Known Allergies  Family History  Problem Relation Age of Onset  . Diabetes Mother   . Cancer Father     Prior to Admission medications   Medication Sig Start Date End Date Taking? Authorizing Provider  Ascorbic Acid (VITAMIN C) 1000 MG tablet Take 1,000 mg by mouth daily.     [provider]  aspirin EC 81 MG tablet Take 81 mg by mouth daily.    [provider]  atorvastatin (LIPITOR) 20 MG tablet Take 20 mg by mouth daily.    [provider]  cholecalciferol (VITAMIN D3) 25 MCG (1000 UNIT) tablet Take 2,000 Units by mouth daily.    [provider]  clindamycin (CLEOCIN) 300 MG capsule Take 300 mg by mouth every 6 (six) hours. 05/03/20   [provider]  Coenzyme Q10 (  COQ10) 50 MG CAPS Take 50 mg by mouth daily.     [provider]  glucose blood test strip Use as instructed 10/01/17   Soyla Dryer, PA-C  HYDROcodone-acetaminophen (NORCO/VICODIN) 5-325 MG tablet Take 1 tablet by mouth every 6 (six) hours as needed for moderate pain. Patient not taking: Reported on 02/14/2020 02/11/20 02/10/21  Vaughan Basta, [place], PA-C  Insulin Glargine [facility] KWIKPEN [place]) Inject 40 Units into the skin at bedtime.     [provider]  Insulin Glargine (BASAGLAR KWIKPEN) 100 UNIT/ML   04/25/20   [provider]  lisinopril (ZESTRIL) 20 MG tablet Take 20 mg by mouth daily. 06/18/19   [provider]  metFORMIN (GLUCOPHAGE) 500 MG tablet Take 1 tablet (500 mg total) by mouth 2 (two) times daily with a meal. 01/20/20   Cristal Ford, DO  Multiple Vitamin (MULTIVITAMIN WITH MINERALS) TABS tablet Take 1 tablet by mouth daily.    [provider]  ONE TOUCH LANCETS MISC Check bs as directed 10/01/17   Soyla Dryer, PA-C  Turmeric (QC TUMERIC COMPLEX) 500 MG CAPS Take 500 mg by mouth daily.     [provider]    Physical Exam: Vitals:   05/04/20 1455 05/04/20 1456  BP: 134/73   Pulse: 84   Resp: 17   Temp: 98 F (36.7 C)   TempSrc: Oral   SpO2: 97%   Weight:  84.4 kg  Height:  _0  (1.803 m)    Constitutional: NAD, calm, comfortable Vitals:   05/04/20 1455 05/04/20 1456  BP: 134/73   Pulse: 84   Resp: 17   Temp: 98 F (36.7 C)   TempSrc: Oral   SpO2: 97%   Weight:  84.4 kg  Height:  _1  (1.803 m)   Eyes: PERRL, lids and conjunctivae normal ENMT: Mucous membranes are moist. Posterior pharynx clear of any exudate or lesions.  Neck: normal, supple, no masses, no thyromegaly Respiratory: clear to auscultation bilaterally, no wheezing, no crackles. Normal respiratory effort. No accessory muscle use.  Cardiovascular: Regular rate and rhythm, no murmurs / rubs / gallops. No extremity edema. 2+ pedal pulses. No carotid bruits.  Abdomen: no tenderness, no masses palpated. No hepatosplenomegaly. Bowel sounds positive.  Musculoskeletal: [name].  No clubbing / cyanosis. No joint deformity upper and lower extremities. Good ROM, no contractures. Normal muscle tone.  Skin: Chronic dry ulcer without drainage to distal aspect of right big toe, also ulcer in the fold, underneath 5th small toe, minimal surrounding erythema, no tenderness. Neurologic: No apparent cranial nerve abnormality, moving all extremities  spontaneously Psychiatric: Normal judgment and insight. Alert and oriented x 3. Normal mood.            Labs on Admission: I have personally reviewed following labs and imaging studies  CBC: Recent Labs  Lab 05/04/20 1607  WBC 8.5  NEUTROABS 5.8  HGB 14.1  HCT 43.2  MCV 90.8  PLT 993   Basic Metabolic Panel: Recent Labs  Lab 05/04/20 1607  NA 136  K 4.2  CL 102  CO2 25  GLUCOSE 180*  BUN 22  CREATININE 0.95  CALCIUM 8.8*   Liver Function Tests: Recent Labs  Lab 05/04/20 1607  AST 28  ALT 31  ALKPHOS 45  BILITOT 1.3*  PROT 7.4  ALBUMIN 4.4    Radiological Exams on Admission: DG Foot Complete Right  Result Date: 05/04/2020 CLINICAL DATA:  Right great toe pain and ulceration, diabetes EXAM: RIGHT FOOT COMPLETE -  3+ VIEW COMPARISON:  05/03/2020, 04/19/2020 FINDINGS: Frontal, oblique, and lateral views of the right foot are obtained. Prior amputation at the fourth proximal phalanx. No acute displaced fracture. Cortical irregularity of the first and fifth distal phalanges consistent with areas of acute osteomyelitis seen on MRI yesterday. Soft tissue ulceration plantar aspect first digit. Diffuse vascular calcifications. IMPRESSION: 1. Findings compatible with known osteomyelitis of the first and fifth digits. Please refer to MRI performed 05/03/2020 for complete description of findings. Electronically Signed   By: Randa Ngo M.D.   On: 05/04/2020 17:22    EKG: None.  Assessment/Plan Principal Problem:   Osteomyelitis (HCC) Active Problems:   Type II diabetes mellitus, uncontrolled (HCC)   Essential hypertension, benign   Diabetic foot infection (HCC)   Hx of BKA, left (HCC)   Acute osteomyelitis right first and fifth toe - afebrile without leukocytosis.  Osteomyelitis findings on x-ray and MRI.  Treated with clindamycin over the past 2 weeks.  Recent osteomyelitis involving fourth toe initially required PICC and was treated with IV antibiotics and  later required amputation 01/2020, vascular surgeon Dr. Carlis Abbott.  Has a [name]. - EDP talked to vascular surgeon on-call Dr. Donzetta Matters recommended admission to [facility], will see in consult -Obtain ESR, CRP -N.p.o. midnight - pending vascular surgery evaluation, may need ID consult. -IV vancomycin and cefepime given in the ED, as he is well-appearing, without cellulitis, stable vitals not septic, will hold off on further antibiotics pending vascular surgery evaluation, if biopsy and cultures will be taken, to increase chances of microbial growth.  History of peripheral artery disease- 12/2019 aortogram + LE angiogram- widely patent arteries except for posterior tibial artery occluded throughout its course. Excellent flow in the foot dominantly through the dorsalis pedis.  No flow-limiting lesions that require intervention, he should have adequate inline flow for wound healing in the right foot. -Hold aspirin for now pending vascular surgery evaluation. -Resume statin  [name]  Essential hypertension -Stable, continue lisinopril  Diabetes mellitus, type II-random glucose 180.  -N.p.o. from midnight, hold Lantus 40 QHs for now -Hold Metformin for now  Peripheral vascular disease -Hold aspirin for now, continue statin  Hyperlipidemia -Continue statin  Procedures: Abdomial aortogram with LE angiogram on 01/17/20  DVT prophylaxis: SCDs Code Status: Full code Family Communication: None at bedside  disposition Plan: > 2 days, pending vascular surgery evaluation, Consults called: Vascular surgery, may also need infectious disease. Admission status: Inpatient, MedSurg I certify that at the point of admission it is my clinical judgment that the patient will require inpatient hospital care spanning beyond 2 midnights from the point of admission due to high intensity of service, high risk for further deterioration and high frequency of surveillance required.    Bethena Roys MD [facility]  05/04/2020, 5:25 PM

## 2020-05-04 NOTE — ED Provider Notes (Signed)
[facility] EMERGENCY DEPARTMENT Provider Note   CSN: [id number] Arrival date & time: 05/04/20  1446     History Chief Complaint  Patient presents with  . Foot Pain    Donald Duran is a 69 y.o. male past history is of diabetes, hypertension, osteomyelitis (currently on clindamycin) who presents for evaluation of osteomyelitis on MRI.  He had a history of right fourth toe amputation done in February 2021 by Dr. Chestine Spore.  He had been on vancomycin via PICC line and had been transitioned to clindamycin.  He has been having some redness, wound noted to the distal tip of the first toe and had been seen by his PCP who got an x-ray and then an MRI.  He had the MRI yesterday and was called today and told there was findings consistent with osteomyelitis and told to come to the emergency department.  He states his sugars have been running between 150-160.  He has been taking insulin has not been missing any doses.  He denies any fever.  The history is provided by the patient.       Past Medical History:  Diagnosis Date  . Diabetes mellitus without complication (HCC)   . Hypertension   . Osteomyelitis (HCC)   . Peripheral vascular disease [facility])     Patient Active Problem List   Diagnosis Date Noted  . Osteomyelitis (HCC) 05/04/2020  . Amputation of toe of right foot (HCC) 03/07/2020  . PICC (peripherally inserted central catheter) in place 02/15/2020  . Medication monitoring encounter 02/15/2020  . Therapeutic drug monitoring 02/15/2020  . Cellulitis 01/15/2020  . Diabetic foot infection (HCC) 01/15/2020  . 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 Pyogenic inflammation of bone (HCC)   . Type II diabetes mellitus, uncontrolled (HCC) 08/13/2017  . Essential hypertension, benign 08/13/2017  . Mixed hyperlipidemia 08/13/2017    Past Surgical History:  Procedure Laterality Date  . ABDOMINAL AORTOGRAM W/LOWER EXTREMITY Right 01/17/2020   Procedure: ABDOMINAL  AORTOGRAM W/LOWER EXTREMITY;  Surgeon: Cephus Shelling, MD;  Location: [facility] INVASIVE CV LAB;  Service: Cardiovascular;  Laterality: Right;  . AMPUTATION Right 02/11/2020   Procedure: AMPUTATION FOURTH TOE;  Surgeon: Cephus Shelling, MD;  Location: [facility] OR;  Service: Vascular;  Laterality: Right;  . LEG AMPUTATION BELOW KNEE Left 2017   Done at [facility] in [place] by Dr. Adan Sis  . OPEN TREATMENT ORBITAL FLOOR BLOWOUT  1977  . TOE AMPUTATION Left   . TONSILLECTOMY         Family History  Problem Relation Age of Onset  . Diabetes Mother   . Cancer Father     Social History   Tobacco Use  . Smoking status: Former Games developer  . Smokeless tobacco: Never Used  . Tobacco comment: 50 years quit  Substance Use Topics  . Alcohol use: No  . Drug use: No    Home Medications Prior to Admission medications   Medication Sig Start Date End Date Taking? Authorizing Provider  Ascorbic Acid (VITAMIN C) 1000 MG tablet Take 1,000 mg by mouth every morning.    Yes [provider]  aspirin EC 81 MG tablet Take 81 mg by mouth at bedtime.    Yes [provider]  atorvastatin (LIPITOR) 20 MG tablet Take 20 mg by mouth daily.   Yes [provider]  cholecalciferol (VITAMIN D3) 25 MCG (1000 UNIT) tablet Take 2,000 Units by mouth daily.   Yes [provider]  clindamycin (CLEOCIN) 300 MG capsule Take 300 mg by mouth every 6 (six) hours. 05/03/20  Yes [provider]  Coenzyme Q10 (COQ10) 50 MG CAPS Take 50 mg by mouth once a week.    Yes [provider]  Insulin Glargine (BASAGLAR KWIKPEN) 100 UNIT/ML Inject 40 Units into the skin at bedtime.  04/25/20  Yes [provider]  lisinopril (ZESTRIL) 20 MG tablet Take 20 mg by mouth daily. 06/18/19  Yes [provider]  metFORMIN (GLUCOPHAGE) 500 MG tablet Take 1 tablet (500 mg total) by mouth 2 (two) times daily with a meal. 01/20/20  Yes Mikhail, [place], DO    Multiple Vitamin (MULTIVITAMIN WITH MINERALS) TABS tablet Take 1 tablet by mouth daily.   Yes [provider]  Turmeric (QC TUMERIC COMPLEX) 500 MG CAPS Take 500 mg by mouth daily.    Yes [provider]  glucose blood test strip Use as instructed 10/01/17   Jacquelin Hawking, PA-C  HYDROcodone-acetaminophen (NORCO/VICODIN) 5-325 MG tablet Take 1 tablet by mouth every 6 (six) hours as needed for moderate pain. Patient not taking: Reported on 02/14/2020 02/11/20 02/10/21  Milinda Antis, PA-C    Allergies    Patient has no known allergies.  Review of Systems   Review of Systems  Constitutional: Negative for fever.  Respiratory: Negative for cough and shortness of breath.   Cardiovascular: Negative for chest pain.  Gastrointestinal: Negative for abdominal pain, nausea and vomiting.  Genitourinary: Negative for dysuria and hematuria.  Skin: Positive for color change and wound.  Neurological: Negative for headaches.  All other systems reviewed and are negative.   Physical Exam Updated Vital Signs BP 134/73 (BP Location: Right Arm)   Pulse 84   Temp 98 F (36.7 C) (Oral)   Resp 17   Ht 5\' 11"  (1.803 m)   Wt 84.4 kg   SpO2 97%   BMI 25.94 kg/m   Physical Exam Vitals and nursing note reviewed.  Constitutional:      Appearance: Normal appearance. He is well-developed.     Comments: Sitting comfortably on examination table.   HENT:     Head: Normocephalic and atraumatic.  Eyes:     General: Lids are normal.     Conjunctiva/sclera: Conjunctivae normal.     Pupils: Pupils are equal, round, and reactive to light.  Cardiovascular:     Rate and Rhythm: Normal rate and regular rhythm.     Pulses: Normal pulses.     Heart sounds: Normal heart sounds. No murmur. No friction rub. No gallop.   Pulmonary:     Effort: Pulmonary effort is normal.     Breath sounds: Normal breath sounds.     Comments: Lungs clear to auscultation bilaterally.  Symmetric chest rise.  No  wheezing, rales, rhonchi. Abdominal:     Palpations: Abdomen is soft. Abdomen is not rigid.     Tenderness: There is no abdominal tenderness. There is no guarding.     Comments: Abdomen is soft, non-distended, non-tender. No rigidity, No guarding. No peritoneal signs.  Musculoskeletal:        General: Normal range of motion.     Cervical back: Full passive range of motion without pain.     Comments: [name].  Right first toe with erythema noted distal end.  He has a small 2 cm in diameter necrotic wound noted to the tip of the toe.  No deformity or crepitus noted.  Right fourth toe potation present.  Skin:  General: Skin is warm and dry.     Capillary Refill: Capillary refill takes less than 2 seconds.  Neurological:     Mental Status: He is alert and oriented to person, place, and time.  Psychiatric:        Speech: Speech normal.              ED Results / Procedures / Treatments   Labs (all labs ordered are listed, but only abnormal results are displayed) Labs Reviewed  COMPREHENSIVE METABOLIC PANEL - Abnormal; Notable for the following components:      Result Value   Glucose, Bld 180 (*)    Calcium 8.8 (*)    Total Bilirubin 1.3 (*)    All other components within normal limits  CULTURE, BLOOD (ROUTINE X 2)  CULTURE, BLOOD (ROUTINE X 2)  RESPIRATORY PANEL BY RT PCR (FLU A&B, COVID)  CBC WITH DIFFERENTIAL/PLATELET  CREATININE, SERUM  SEDIMENTATION RATE  C-REACTIVE PROTEIN    EKG None  Radiology DG Foot Complete Right  Result Date: 05/04/2020 CLINICAL DATA:  Right great toe pain and ulceration, diabetes EXAM: RIGHT FOOT COMPLETE - 3+ VIEW COMPARISON:  05/03/2020, 04/19/2020 FINDINGS: Frontal, oblique, and lateral views of the right foot are obtained. Prior amputation at the fourth proximal phalanx. No acute displaced fracture. Cortical irregularity of the first and fifth distal phalanges consistent with areas of acute osteomyelitis seen on MRI yesterday. Soft  tissue ulceration plantar aspect first digit. Diffuse vascular calcifications. IMPRESSION: 1. Findings compatible with known osteomyelitis of the first and fifth digits. Please refer to MRI performed 05/03/2020 for complete description of findings. Electronically Signed   By: Randa Ngo M.D.   On: 05/04/2020 17:22    Procedures Procedures (including critical care time)  Medications Ordered in ED Medications  ceFEPIme (MAXIPIME) 2 g in sodium chloride 0.9 % 100 mL IVPB (2 g Intravenous New Bag/Given 05/04/20 1756)  vancomycin (VANCOREADY) IVPB 2000 mg/400 mL (has no administration in time range)  ceFEPIme (MAXIPIME) 2 g in sodium chloride 0.9 % 100 mL IVPB (has no administration in time range)  vancomycin (VANCOCIN) IVPB 1000 mg/200 mL premix (has no administration in time range)    ED Course  I have reviewed the triage vital signs and the nursing notes.  Pertinent labs & imaging results that were available during my care of the patient were reviewed by me and considered in my medical decision making (see chart for details).    MDM Rules/Calculators/A&P                      69 year old gentleman past medical history of diabetes, hypertension, osteomyelitis who presents for evaluation of MRI findings consistent with acute osteomyelitis.  History of osteomyelitis in his right lower extremity had been seen by vascular surgeon Dr. Carlis Abbott with a recent right fourth toe amputation done in February 2021.  Had been on vancomycin then switched to p.o. clindamycin which she had been taking.  PCP had ordered an MRI for evaluation which showed osteomyelitis.  He was told to come to the emergency department for further evaluation.  On initial ED arrival, he is afebrile, nontoxic-appearing.  Vital signs are stable.  He has a [name].  Right foot with fourth digit amputation.  The right first toe is slightly erythematous with evidence of necrotic wound noted to the distal tip.  He has the MRI with him that  confirms osteomyelitis.  We will plan for labs.  Review of his records show  that he has been monitored by Dr. Chestine Spore who initially did his amputation in 2021.  We will cut the discussed with vascular.  MRI shows acute osteomyelitis of the distal 1 cm of the right great toe distal phalanx.  He also has findings compatible with acute osteomyelitis of the distal phalanx of the fifth toe.  Discussed patient with Dr. Randie Heinz (Vascular) who will agree to consult on patient.  Recommends medical admission with transfer to [facility].  CBC shows no leukocytosis or anemia.  CMP is unremarkable.  Started patient on vancomycin and cefepime per pharmacy.  Discussed patient with Dr. Mariea Clonts (hospitalist) who accepts the patient for admission.  Portions of this note were generated with Scientist, clinical (histocompatibility and immunogenetics). Dictation errors may occur despite best attempts at proofreading.   Final Clinical Impression(s) / ED Diagnoses Final diagnoses:  Osteomyelitis of right foot, unspecified type [facility])    Rx / DC Orders ED Discharge Orders    None       Maxwell Caul, PA-C 05/04/20 1818    Vanetta Mulders, MD 05/04/20 2017

## 2020-05-04 NOTE — ED Provider Notes (Signed)
Medical screening examination/treatment/procedure(s) were conducted as a shared visit with non-physician practitioner(s) and myself.  I personally evaluated the patient during the encounter.      Patient seen by me.  Patient referred in for osteomyelitis of his right foot.  Patient had MRI yesterday in [place] and was advised to come back to the emergency department.  Patient last on IV antibiotics March 1.  That was vancomycin.  Did have a PICC line.  Patient has been some oral clindamycin.  The problem is with his right foot.  Physician assistant discussed it with vascular surgery Dr. Pascal Lux recommends admission via the hospitalist down at [facility].  Patient nontoxic no acute distress.  No fevers.  Not tachycardic oxygen saturation on room air 97%.  Past medical history significant for peripheral vascular disease osteomyelitis and diabetes and hypertension   Vanetta Mulders, MD 05/04/20 [phone]

## 2020-05-04 NOTE — ED Triage Notes (Signed)
Pt states he has osteomyelitis in his right foot. Patient had an MRI yesterday in [place] and was advised to come back to the ED.

## 2020-05-04 NOTE — Progress Notes (Signed)
Pharmacy Antibiotic Note  Donald Duran is a 69 y.o. male admitted on 05/04/2020 with osteomyelitis.  Pharmacy has been consulted for Vancomycin and Cefepime dosing.  Plan: Vancomycin 2000 mg IV x 1 dose. Vancomycin 1000 mg IV every 12 hours. Predicted AUC 478. Cefepime 2000 mg IV every 8 hours. Monitor labs, c/s, and vanco level as indicated.  Height: 5\' 11"  (180.3 cm) Weight: 84.4 kg (186 lb) IBW/kg (Calculated) : 75.3  Temp (24hrs), Avg:98 F (36.7 C), Min:98 F (36.7 C), Max:98 F (36.7 C)  Recent Labs  Lab 05/04/20 1607  WBC 8.5  CREATININE 0.95    Estimated Creatinine Clearance: 80.4 mL/min (by C-G formula based on SCr of 0.95 mg/dL).    No Known Allergies  Antimicrobials this admission: Vanco 5/6 >>  Cefepime 5/6 >>   Dose adjustments this admission: N/A  Microbiology results: 5/6 BCx: pending   Thank you for allowing pharmacy to be a part of this patient's care.  7/6 05/04/2020 5:13 PM

## 2020-05-05 ENCOUNTER — Encounter (HOSPITAL_COMMUNITY): Payer: Self-pay | Admitting: Internal Medicine

## 2020-05-05 ENCOUNTER — Inpatient Hospital Stay (HOSPITAL_COMMUNITY): Payer: Medicare Other | Admitting: Anesthesiology

## 2020-05-05 ENCOUNTER — Encounter (HOSPITAL_COMMUNITY)
Admission: EM | Disposition: A | Discharge status: Home / Self Care | Payer: Self-pay | Source: Home / Self Care | Attending: Internal Medicine

## 2020-05-05 DIAGNOSIS — M86671 Other chronic osteomyelitis, right ankle and foot: Secondary | ICD-10-CM

## 2020-05-05 HISTORY — PX: AMPUTATION: SHX166

## 2020-05-05 LAB — SURGICAL PCR SCREEN
MRSA, PCR: NEGATIVE
Staphylococcus aureus: NEGATIVE

## 2020-05-05 LAB — GLUCOSE, CAPILLARY
Glucose-Capillary: 156 mg/dL — ABNORMAL HIGH (ref 70–99)
Glucose-Capillary: 143 mg/dL — ABNORMAL HIGH (ref 70–99)
Glucose-Capillary: 143 mg/dL — ABNORMAL HIGH (ref 70–99)
Glucose-Capillary: 129 mg/dL — ABNORMAL HIGH (ref 70–99)
Glucose-Capillary: 195 mg/dL — ABNORMAL HIGH (ref 70–99)
Glucose-Capillary: 133 mg/dL — ABNORMAL HIGH (ref 70–99)

## 2020-05-05 LAB — CBG MONITORING, ED: Glucose-Capillary: 118 mg/dL — ABNORMAL HIGH (ref 70–99)

## 2020-05-05 LAB — SEDIMENTATION RATE: Sed Rate: 25 mm/hr — ABNORMAL HIGH (ref 0–16)

## 2020-05-05 SURGERY — AMPUTATION DIGIT
Anesthesia: Monitor Anesthesia Care | Site: Toe | Laterality: Right

## 2020-05-05 SURGERY — AMPUTATION DIGIT
Anesthesia: Monitor Anesthesia Care | Laterality: Right

## 2020-05-05 MED ORDER — LACTATED RINGERS IV SOLN: INTRAVENOUS | Status: DC

## 2020-05-05 MED ORDER — ONDANSETRON HCL 4 MG PO TABS: 4.0000 mg | ORAL_TABLET | Freq: Four times a day (QID) | ORAL | Status: DC | PRN

## 2020-05-05 MED ORDER — MEPERIDINE HCL 25 MG/ML IJ SOLN: 6.2500 mg | INTRAMUSCULAR | Status: DC | PRN

## 2020-05-05 MED ORDER — 0.9 % SODIUM CHLORIDE (POUR BTL) OPTIME
TOPICAL | Status: DC | PRN
Start: 2020-05-05 — End: 2020-05-05
  Administered 2020-05-05: 1000 mL

## 2020-05-05 MED ORDER — OXYCODONE HCL 5 MG PO TABS: 5.0000 mg | ORAL_TABLET | Freq: Once | ORAL | Status: DC | PRN

## 2020-05-05 MED ORDER — ACETAMINOPHEN 650 MG RE SUPP: 650.0000 mg | Freq: Four times a day (QID) | RECTAL | Status: DC | PRN

## 2020-05-05 MED ORDER — PHENYLEPHRINE 40 MCG/ML (10ML) SYRINGE FOR IV PUSH (FOR BLOOD PRESSURE SUPPORT)
PREFILLED_SYRINGE | INTRAVENOUS | Status: AC
  Filled 2020-05-05: qty 20

## 2020-05-05 MED ORDER — BUPIVACAINE HCL (PF) 0.25 % IJ SOLN
INTRAMUSCULAR | Status: DC | PRN
  Administered 2020-05-05: 40 mL

## 2020-05-05 MED ORDER — ONDANSETRON HCL 4 MG/2ML IJ SOLN: 4.0000 mg | Freq: Four times a day (QID) | INTRAMUSCULAR | Status: DC | PRN

## 2020-05-05 MED ORDER — OXYCODONE HCL 5 MG/5ML PO SOLN: 5.0000 mg | Freq: Once | ORAL | Status: DC | PRN

## 2020-05-05 MED ORDER — MIDAZOLAM HCL 2 MG/2ML IJ SOLN: 2.0000 mg | Freq: Once | INTRAMUSCULAR | Status: AC

## 2020-05-05 MED ORDER — MIDAZOLAM HCL 2 MG/2ML IJ SOLN: 0.5000 mg | Freq: Once | INTRAMUSCULAR | Status: DC | PRN

## 2020-05-05 MED ORDER — FENTANYL CITRATE (PF) 250 MCG/5ML IJ SOLN
INTRAMUSCULAR | Status: AC
  Filled 2020-05-05: qty 5

## 2020-05-05 MED ORDER — FENTANYL CITRATE (PF) 100 MCG/2ML IJ SOLN: 100.0000 ug | Freq: Once | INTRAMUSCULAR | Status: AC

## 2020-05-05 MED ORDER — CEFAZOLIN SODIUM-DEXTROSE 2-4 GM/100ML-% IV SOLN
INTRAVENOUS | Status: AC
  Filled 2020-05-05: qty 100

## 2020-05-05 MED ORDER — HYDROMORPHONE HCL 1 MG/ML IJ SOLN: 0.2500 mg | INTRAMUSCULAR | Status: DC | PRN

## 2020-05-05 MED ORDER — POLYETHYLENE GLYCOL 3350 17 G PO PACK: 17.0000 g | PACK | Freq: Every day | ORAL | Status: DC | PRN

## 2020-05-05 MED ORDER — PROMETHAZINE HCL 25 MG/ML IJ SOLN: 6.2500 mg | INTRAMUSCULAR | Status: DC | PRN

## 2020-05-05 MED ORDER — ACETAMINOPHEN 325 MG PO TABS: 650.0000 mg | ORAL_TABLET | Freq: Four times a day (QID) | ORAL | Status: DC | PRN

## 2020-05-05 MED ORDER — CEFAZOLIN SODIUM-DEXTROSE 2-3 GM-%(50ML) IV SOLR
INTRAVENOUS | Status: DC | PRN
  Administered 2020-05-05: 2 g via INTRAVENOUS

## 2020-05-05 MED ORDER — ONDANSETRON HCL 4 MG/2ML IJ SOLN
INTRAMUSCULAR | Status: DC | PRN
  Administered 2020-05-05: 4 mg via INTRAVENOUS

## 2020-05-05 MED ORDER — DEXTROSE-NACL 5-0.9 % IV SOLN: INTRAVENOUS | Status: AC

## 2020-05-05 MED ORDER — LISINOPRIL 20 MG PO TABS: 20.0000 mg | ORAL_TABLET | Freq: Every day | ORAL | Status: DC

## 2020-05-05 MED ORDER — FENTANYL CITRATE (PF) 100 MCG/2ML IJ SOLN
INTRAMUSCULAR | Status: AC
  Administered 2020-05-05: 100 ug via INTRAVENOUS
  Filled 2020-05-05: qty 2

## 2020-05-05 MED ORDER — PROPOFOL 500 MG/50ML IV EMUL
INTRAVENOUS | Status: DC | PRN
  Administered 2020-05-05: 50 ug/kg/min via INTRAVENOUS

## 2020-05-05 MED ORDER — LIDOCAINE HCL (PF) 1 % IJ SOLN
INTRAMUSCULAR | Status: AC
  Filled 2020-05-05: qty 30

## 2020-05-05 MED ORDER — CHLORHEXIDINE GLUCONATE CLOTH 2 % EX PADS
6.0000 | MEDICATED_PAD | Freq: Every day | CUTANEOUS | Status: DC
  Administered 2020-05-05 – 2020-05-06 (×2): 6 via TOPICAL

## 2020-05-05 MED ORDER — MIDAZOLAM HCL 2 MG/2ML IJ SOLN
INTRAMUSCULAR | Status: AC
  Filled 2020-05-05: qty 2

## 2020-05-05 MED ORDER — PHENYLEPHRINE 40 MCG/ML (10ML) SYRINGE FOR IV PUSH (FOR BLOOD PRESSURE SUPPORT)
PREFILLED_SYRINGE | INTRAVENOUS | Status: DC | PRN
  Administered 2020-05-05: 120 ug via INTRAVENOUS
  Administered 2020-05-05: 80 ug via INTRAVENOUS
  Administered 2020-05-05: 120 ug via INTRAVENOUS

## 2020-05-05 MED ORDER — LIDOCAINE HCL (PF) 1 % IJ SOLN
INTRAMUSCULAR | Status: DC | PRN
  Administered 2020-05-05: 30 mL

## 2020-05-05 MED ORDER — LISINOPRIL 5 MG PO TABS
5.0000 mg | ORAL_TABLET | Freq: Every day | ORAL | Status: DC
  Administered 2020-05-06: 5 mg via ORAL
  Filled 2020-05-05: qty 1

## 2020-05-05 MED ORDER — PROPOFOL 10 MG/ML IV BOLUS
INTRAVENOUS | Status: AC
  Filled 2020-05-05: qty 20

## 2020-05-05 MED ORDER — ATORVASTATIN CALCIUM 10 MG PO TABS
20.0000 mg | ORAL_TABLET | Freq: Every day | ORAL | Status: DC
  Administered 2020-05-06: 20 mg via ORAL
  Filled 2020-05-05: qty 2

## 2020-05-05 MED ORDER — MIDAZOLAM HCL 2 MG/2ML IJ SOLN
INTRAMUSCULAR | Status: AC
  Administered 2020-05-05: 2 mg via INTRAVENOUS
  Filled 2020-05-05: qty 2

## 2020-05-05 SURGICAL SUPPLY — 28 items
BNDG GAUZE ELAST 4 BULKY (GAUZE/BANDAGES/DRESSINGS) ×3 IMPLANT
CANISTER SUCT 3000ML PPV (MISCELLANEOUS) ×3 IMPLANT
CLIP LIGATING EXTRA MED SLVR (CLIP) ×3 IMPLANT
CLIP LIGATING EXTRA SM BLUE (MISCELLANEOUS) ×3 IMPLANT
COVER SURGICAL LIGHT HANDLE (MISCELLANEOUS) ×6 IMPLANT
COVER WAND RF STERILE (DRAPES) ×1 IMPLANT
DRAPE EXTREMITY T 121X128X90 (DISPOSABLE) ×3 IMPLANT
ELECT REM PT RETURN 9FT ADLT (ELECTROSURGICAL) ×3
ELECTRODE REM PT RTRN 9FT ADLT (ELECTROSURGICAL) ×1 IMPLANT
GAUZE SPONGE 4X4 12PLY STRL (GAUZE/BANDAGES/DRESSINGS) ×3 IMPLANT
GLOVE SS BIOGEL STRL SZ 7.5 (GLOVE) ×1 IMPLANT
GLOVE SUPERSENSE BIOGEL SZ 7.5 (GLOVE) ×2
GOWN STRL REUS W/ TWL LRG LVL3 (GOWN DISPOSABLE) ×3 IMPLANT
GOWN STRL REUS W/TWL LRG LVL3 (GOWN DISPOSABLE) ×6
KIT BASIN OR (CUSTOM PROCEDURE TRAY) ×3 IMPLANT
KIT TURNOVER KIT B (KITS) ×3 IMPLANT
NEEDLE 22X1 1/2 (OR ONLY) (NEEDLE) IMPLANT
NS IRRIG 1000ML POUR BTL (IV SOLUTION) ×3 IMPLANT
PACK GENERAL/GYN (CUSTOM PROCEDURE TRAY) ×3 IMPLANT
PAD ARMBOARD 7.5X6 YLW CONV (MISCELLANEOUS) ×6 IMPLANT
SUT ETHILON 3 0 PS 1 (SUTURE) ×3 IMPLANT
SUT VIC AB 3-0 SH 27 (SUTURE) ×4
SUT VIC AB 3-0 SH 27X BRD (SUTURE) ×1 IMPLANT
SUT VIC AB 3-0 SH 27XBRD (SUTURE) IMPLANT
SYR CONTROL 10ML LL (SYRINGE) IMPLANT
TOWEL GREEN STERILE (TOWEL DISPOSABLE) ×6 IMPLANT
UNDERPAD 30X30 (UNDERPADS AND DIAPERS) ×3 IMPLANT
WATER STERILE IRR 1000ML POUR (IV SOLUTION) ×3 IMPLANT

## 2020-05-05 NOTE — Progress Notes (Signed)
Orthopedic Tech Progress Note Patient Details:  Nesta Kimple March 13, 1951 [id number] RN called requesting a DARCO SHOE  Ortho Devices Type of Ortho Device: Darco shoe Ortho Device/Splint Location: RLE Ortho Device/Splint Interventions: Application   Post Interventions Patient Tolerated: Well Instructions Provided: Care of device   Donald Pore 05/05/2020, 3:49 PM

## 2020-05-05 NOTE — H&P (View-Only) (Signed)
Hospital Consult    Reason for Consult: Osteomyelitis right great toe Referring Physician: Dr. Landis Gandy MRN #:  [id number]  History of Present Illness: This is a 69 y.o. male history of hypertension, diabetes and previous left below-knee amputation.  His recent undergone right fourth toe amputation.  Subsequently was on antibiotics for 4 weeks continued through the March 1 of this year.  Patient then took clindamycin for 2 weeks has been followed at the wound care center.  Now has MRI demonstrating osteomyelitis.  Denies any constitutional symptoms.  Really has no pain in the foot.  He has previously undergone angiography.  Past Medical History:  Diagnosis Date  . Diabetes mellitus without complication ([facility])   . Hypertension   . Osteomyelitis ([place])   . Peripheral vascular disease [facility])     Past Surgical History:  Procedure Laterality Date  . ABDOMINAL AORTOGRAM W/LOWER EXTREMITY Right 01/17/2020   Procedure: ABDOMINAL AORTOGRAM W/LOWER EXTREMITY;  Surgeon: Marty Heck, MD;  Location: [facility];  Service: Cardiovascular;  Laterality: Right;  . AMPUTATION Right 02/11/2020   Procedure: AMPUTATION FOURTH TOE;  Surgeon: Marty Heck, MD;  Location: [place];  Service: Vascular;  Laterality: Right;  . LEG AMPUTATION BELOW KNEE Left 2017   Done at [facility] system in [place] by Dr. Shara Blazing  . OPEN TREATMENT ORBITAL FLOOR [place]  . TOE AMPUTATION Left   . TONSILLECTOMY      No Known Allergies  Prior to Admission medications   Medication Sig Start Date End Date Taking? Authorizing Provider  Ascorbic Acid (VITAMIN C) 1000 MG tablet Take 1,000 mg by mouth every morning.    Yes [provider]  aspirin EC 81 MG tablet Take 81 mg by mouth at bedtime.    Yes [provider]  atorvastatin (LIPITOR) 20 MG tablet Take 20 mg by mouth daily.   Yes [provider]  cholecalciferol (VITAMIN D3) 25 MCG (1000 UNIT)  tablet Take 2,000 Units by mouth daily.   Yes [provider]  clindamycin (CLEOCIN) 300 MG capsule Take 300 mg by mouth every 6 (six) hours. 05/03/20  Yes [provider]  Coenzyme Q10 (COQ10) 50 MG CAPS Take 50 mg by mouth once a week.    Yes [provider]  Insulin Glargine (BASAGLAR KWIKPEN) 100 UNIT/ML Inject 40 Units into the skin at bedtime.  04/25/20  Yes [provider]  lisinopril (ZESTRIL) 20 MG tablet Take 20 mg by mouth daily. 06/18/19  Yes [provider]  metFORMIN (GLUCOPHAGE) 500 MG tablet Take 1 tablet (500 mg total) by mouth 2 (two) times daily with a meal. 01/20/20  Yes Mikhail, [place], DO  Multiple Vitamin (MULTIVITAMIN WITH MINERALS) TABS tablet Take 1 tablet by mouth daily.   Yes [provider]  Turmeric (QC TUMERIC COMPLEX) 500 MG CAPS Take 500 mg by mouth daily.    Yes [provider]  glucose blood test strip Use as instructed 10/01/17   Soyla Dryer, PA-C  HYDROcodone-acetaminophen (NORCO/VICODIN) 5-325 MG tablet Take 1 tablet by mouth every 6 (six) hours as needed for moderate pain. Patient not taking: Reported on 02/14/2020 02/11/20 02/10/21  Barbie Banner, PA-C    Social History   Socioeconomic History  . Marital status: Single    Spouse name: Not on file  . Number of children: Not on file  . Years of education: Not on file  . Highest education level: Not on file  Occupational  History  . Not on file  Tobacco Use  . Smoking status: Former Smoker  . Smokeless tobacco: Never Used  . Tobacco comment: 50 years quit  Substance and Sexual Activity  . Alcohol use: No  . Drug use: No  . Sexual activity: Not on file  Other Topics Concern  . Not on file  Social History Narrative  . Not on file   Social Determinants of Health   Financial Resource Strain:   . Difficulty of Paying Living Expenses:   Food Insecurity:   . Worried About Running Out of Food in the Last Year:   . Ran Out of Food in  the Last Year:   Transportation Needs:   . Lack of Transportation (Medical):   . Lack of Transportation (Non-Medical):   Physical Activity:   . Days of Exercise per Week:   . Minutes of Exercise per Session:   Stress:   . Feeling of Stress :   Social Connections:   . Frequency of Communication with Friends and Family:   . Frequency of Social Gatherings with Friends and Family:   . Attends Religious Services:   . Active Member of Clubs or Organizations:   . Attends Club or Organization Meetings:   . Marital Status:   Intimate Partner Violence:   . Fear of Current or Ex-Partner:   . Emotionally Abused:   . Physically Abused:   . Sexually Abused:     Family History  Problem Relation Age of Onset  . Diabetes Mother   . Cancer Father     ROS:  Cardiovascular: [] chest pain/pressure [] palpitations [] SOB lying flat [] DOE [] pain in legs while walking [] pain in legs at rest [] pain in legs at night [] non-healing ulcers [] hx of DVT [] swelling in legs  Pulmonary: [] productive cough [] asthma/wheezing [] home O2  Neurologic: [] weakness in [] arms [] legs [] numbness in [] arms [] legs [] hx of CVA [] mini stroke []difficulty speaking or slurred speech [] temporary loss of vision in one eye [] dizziness  Hematologic: [] hx of cancer [] bleeding problems [] problems with blood clotting easily  Endocrine:   [x] diabetes [] thyroid disease  GI [] vomiting blood [] blood in stool  GU: [] CKD/renal failure [] HD--[] M/W/F or [] T/T/S [] burning with urination [] blood in urine  Psychiatric: [] anxiety [] depression  Musculoskeletal: [] arthritis [] joint pain  Integumentary: [] rashes [x] ulcers  Constitutional: [] fever [] chills   Physical Examination  Vitals:   05/05/20 0251 05/05/20 0741  BP: 130/72 117/63  Pulse: 70 72  Resp: 18 18  Temp: 98.2 F (36.8 C) 98.5 F (36.9 C)  SpO2: 97% 97%   Body mass index is 25.94  kg/m.  General:  nad HENT: WNL, normocephalic mask in place Pulmonary: normal non-labored breathing Cardiac: Palpable right common femoral, popliteal and weakly palpable right dorsalis pedis arteries Abdomen:  soft, NT/ND, no masses Extremities: Focal ulceration distal tip right great toe Left below-knee amputation well-healed Neurologic: A&O X 3  CBC    Component Value Date/Time   WBC 8.5 05/04/2020 1607   RBC 4.76 05/04/2020 1607   HGB 14.1 05/04/2020 1607   HCT 43.2 05/04/2020 1607   PLT 182 05/04/2020 1607   MCV 90.8 05/04/2020 1607   MCH 29.6 05/04/2020 1607   MCHC 32.6 05/04/2020 1607   RDW 13.8 05/04/2020 1607   LYMPHSABS 1.6   05/04/2020 1607   MONOABS 0.7 05/04/2020 1607   EOSABS 0.3 05/04/2020 1607   BASOSABS 0.1 05/04/2020 1607    BMET    Component Value Date/Time   NA 136 05/04/2020 1607   K 4.2 05/04/2020 1607   CL 102 05/04/2020 1607   CO2 25 05/04/2020 1607   GLUCOSE 180 (H) 05/04/2020 1607   BUN 22 05/04/2020 1607   BUN 18 06/10/2017 0000   CREATININE 0.95 05/04/2020 1607   CALCIUM 8.8 (L) 05/04/2020 1607   GFRNONAA >60 05/04/2020 1607   GFRAA >60 05/04/2020 1607    COAGS: Lab Results  Component Value Date   INR 1.0 01/17/2020     Non-Invasive Vascular Imaging:   MRI reviewed and demonstrates osteomyelitis of tip of Right great toe  ASSESSMENT/PLAN: This is a 69 y.o. male status post right fourth toe amputation which is well-healed.  Now has osteomyelitis of the great toe with ulceration that has not healed despite prolonged antibiotics.  I reviewed his angiography which demonstrates no flow limitation.  He has a palpable dorsalis pedis pulse.  Patient is n.p.o. we will plan for amputation of the right great toe today in the OR.  Shondra Capps C. Amaar Oshita, MD [facility] [place] Office: [phone] Pager: [phone]  

## 2020-05-05 NOTE — Anesthesia Procedure Notes (Signed)
Anesthesia Regional Block: Ankle block   Pre-Anesthetic Checklist: ,, timeout performed, Correct Patient, Correct Site, Correct Laterality, Correct Procedure, Correct Position, site marked, Risks and benefits discussed,  Surgical consent,  Pre-op evaluation,  At surgeon's request and post-op pain management  Laterality: Right and Lower  Prep: chloraprep       Needles:   Needle Type: Quincke     Needle Length: 4cm  Needle Gauge: 25     Additional Needles:   (perineural infiltration)  Narrative:  Start time: 05/05/2020 12:49 PM End time: 05/05/2020 12:57 PM Injection made incrementally with aspirations every 5 mL.  Performed by: Personally  Anesthesiologist: Jairo Ben, MD  Additional Notes: Pt identified in Holding room.  Monitors applied. Working IV access confirmed. Sterile prep R ankle.  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  Total 30cc 0.5% Bupivacaine injected incrementally after negative test dose.  Patient asymptomatic, VSS, no heme aspirated, tolerated well.  Sandford Craze, MD

## 2020-05-05 NOTE — Interval H&P Note (Signed)
History and Physical Interval Note:  05/05/2020 12:42 PM  Donald Duran  has presented today for surgery, with the diagnosis of Osteomylitis right great toe..  The various methods of treatment have been discussed with the patient and family. After consideration of risks, benefits and other options for treatment, the patient has consented to  Procedure(s): AMPUTATION RIGHT GREAT TOE (Right) as a surgical intervention.  The patient's history has been reviewed, patient examined, no change in status, stable for surgery.  I have reviewed the patient's chart and labs.  Questions were answered to the patient's satisfaction.     Gretta Began

## 2020-05-05 NOTE — Op Note (Signed)
    OPERATIVE REPORT  DATE OF SURGERY: 05/05/2020  PATIENT: Donald Duran, 69 y.o. male MRN: [id number]  DOB: 05-22-1951  PRE-OPERATIVE DIAGNOSIS: Osteo right great toe  POST-OPERATIVE DIAGNOSIS:  Same  PROCEDURE: Right great toe amputation  SURGEON:  Gretta Began, M.D.  PHYSICIAN ASSISTANT: Nurse  ANESTHESIA: Ankle block  EBL: per anesthesia record  Total I/O In: 859.6 [I.V.:809.6; IV Piggyback:50] Out: -   BLOOD ADMINISTERED: none  DRAINS: none  SPECIMEN: none  COUNTS CORRECT:  YES  PATIENT DISPOSITION:  PACU - hemodynamically stable  PROCEDURE DETAILS: Patient was taken operating placed to [facility].  The right foot prepped draped in a sterile fashion.  Fishmouth type incision was made at the base of the great toe and carried down through the digital bone.  The periosteum was elevated proximally and the bone was divided with bone shears.  The bone edges were smoothed with a bone rasp.  There was excellent bleeding with pulsatile bleeding from the skin edges in the subcutaneous tissue.  This was controlled with electrocautery.  The deep tissue was closed with several 3-0 Vicryl interrupted sutures to close over the bone and the skin was closed with a 3-0 nylon mattress suture.  Sterile dressing was applied and the patient was transferred to the recovery in stable condition   Larina Earthly, M.D., [facility] 05/05/2020 4:06 PM

## 2020-05-05 NOTE — Transfer of Care (Signed)
Immediate Anesthesia Transfer of Care Note  Patient: Donald Duran  Procedure(s) Performed: AMPUTATION RIGHT GREAT TOE (Right Toe)  Patient Location: PACU  Anesthesia Type:MAC combined with regional for post-op pain  Level of Consciousness: awake, alert  and oriented  Airway & Oxygen Therapy: Patient Spontanous Breathing and Patient connected to face mask oxygen  Post-op Assessment: Report given to RN and Post -op Vital signs reviewed and stable  Post vital signs: Reviewed and stable  Last Vitals:  Vitals Value Taken Time  BP 119/71 05/05/20 1355  Temp    Pulse 69 05/05/20 1357  Resp 13 05/05/20 1357  SpO2 93 % 05/05/20 1357  Vitals shown include unvalidated device data.  Last Pain:  Vitals:   05/05/20 1355  TempSrc:   PainSc: (P) 0-No pain      Patients Stated Pain Goal: 3 (81/38/87 1959)  Complications: No apparent anesthesia complications

## 2020-05-05 NOTE — Consult Note (Signed)
Hospital Consult    Reason for Consult: Osteomyelitis right great toe Referring Physician: Dr. Landis Gandy MRN #:  [id number]  History of Present Illness: This is a 69 y.o. male history of hypertension, diabetes and previous left below-knee amputation.  His recent undergone right fourth toe amputation.  Subsequently was on antibiotics for 4 weeks continued through the March 1 of this year.  Patient then took clindamycin for 2 weeks has been followed at the wound care center.  Now has MRI demonstrating osteomyelitis.  Denies any constitutional symptoms.  Really has no pain in the foot.  He has previously undergone angiography.  Past Medical History:  Diagnosis Date  . Diabetes mellitus without complication ([facility])   . Hypertension   . Osteomyelitis ([place])   . Peripheral vascular disease [facility])     Past Surgical History:  Procedure Laterality Date  . ABDOMINAL AORTOGRAM W/LOWER EXTREMITY Right 01/17/2020   Procedure: ABDOMINAL AORTOGRAM W/LOWER EXTREMITY;  Surgeon: Marty Heck, MD;  Location: [facility];  Service: Cardiovascular;  Laterality: Right;  . AMPUTATION Right 02/11/2020   Procedure: AMPUTATION FOURTH TOE;  Surgeon: Marty Heck, MD;  Location: [place];  Service: Vascular;  Laterality: Right;  . LEG AMPUTATION BELOW KNEE Left 2017   Done at [facility] system in [place] by Dr. Shara Blazing  . OPEN TREATMENT ORBITAL FLOOR [place]  . TOE AMPUTATION Left   . TONSILLECTOMY      No Known Allergies  Prior to Admission medications   Medication Sig Start Date End Date Taking? Authorizing Provider  Ascorbic Acid (VITAMIN C) 1000 MG tablet Take 1,000 mg by mouth every morning.    Yes [provider]  aspirin EC 81 MG tablet Take 81 mg by mouth at bedtime.    Yes [provider]  atorvastatin (LIPITOR) 20 MG tablet Take 20 mg by mouth daily.   Yes [provider]  cholecalciferol (VITAMIN D3) 25 MCG (1000 UNIT)  tablet Take 2,000 Units by mouth daily.   Yes [provider]  clindamycin (CLEOCIN) 300 MG capsule Take 300 mg by mouth every 6 (six) hours. 05/03/20  Yes [provider]  Coenzyme Q10 (COQ10) 50 MG CAPS Take 50 mg by mouth once a week.    Yes [provider]  Insulin Glargine (BASAGLAR KWIKPEN) 100 UNIT/ML Inject 40 Units into the skin at bedtime.  04/25/20  Yes [provider]  lisinopril (ZESTRIL) 20 MG tablet Take 20 mg by mouth daily. 06/18/19  Yes [provider]  metFORMIN (GLUCOPHAGE) 500 MG tablet Take 1 tablet (500 mg total) by mouth 2 (two) times daily with a meal. 01/20/20  Yes Mikhail, [place], DO  Multiple Vitamin (MULTIVITAMIN WITH MINERALS) TABS tablet Take 1 tablet by mouth daily.   Yes [provider]  Turmeric (QC TUMERIC COMPLEX) 500 MG CAPS Take 500 mg by mouth daily.    Yes [provider]  glucose blood test strip Use as instructed 10/01/17   Soyla Dryer, PA-C  HYDROcodone-acetaminophen (NORCO/VICODIN) 5-325 MG tablet Take 1 tablet by mouth every 6 (six) hours as needed for moderate pain. Patient not taking: Reported on 02/14/2020 02/11/20 02/10/21  Barbie Banner, PA-C    Social History   Socioeconomic History  . Marital status: Single    Spouse name: Not on file  . Number of children: Not on file  . Years of education: Not on file  . Highest education level: Not on file  Occupational  History  . Not on file  Tobacco Use  . Smoking status: Former Games developer  . Smokeless tobacco: Never Used  . Tobacco comment: 50 years quit  Substance and Sexual Activity  . Alcohol use: No  . Drug use: No  . Sexual activity: Not on file  Other Topics Concern  . Not on file  Social History Narrative  . Not on file   Social Determinants of Health   Financial Resource Strain:   . Difficulty of Paying Living Expenses:   Food Insecurity:   . Worried About Programme researcher, broadcasting/film/video in the Last Year:   . Barista in  the Last Year:   Transportation Needs:   . Freight forwarder (Medical):   Marland Kitchen Lack of Transportation (Non-Medical):   Physical Activity:   . Days of Exercise per Week:   . Minutes of Exercise per Session:   Stress:   . Feeling of Stress :   Social Connections:   . Frequency of Communication with Friends and Family:   . Frequency of Social Gatherings with Friends and Family:   . Attends Religious Services:   . Active Member of Clubs or Organizations:   . Attends Banker Meetings:   Marland Kitchen Marital Status:   Intimate Partner Violence:   . Fear of Current or Ex-Partner:   . Emotionally Abused:   Marland Kitchen Physically Abused:   . Sexually Abused:     Family History  Problem Relation Age of Onset  . Diabetes Mother   . Cancer Father     ROS:  Cardiovascular: []  chest pain/pressure []  palpitations []  SOB lying flat []  DOE []  pain in legs while walking []  pain in legs at rest []  pain in legs at night []  non-healing ulcers []  hx of DVT []  swelling in legs  Pulmonary: []  productive cough []  asthma/wheezing []  home O2  Neurologic: []  weakness in []  arms []  legs []  numbness in []  arms []  legs []  hx of CVA []  mini stroke [] difficulty speaking or slurred speech []  temporary loss of vision in one eye []  dizziness  Hematologic: []  hx of cancer []  bleeding problems []  problems with blood clotting easily  Endocrine:   [x]  diabetes []  thyroid disease  GI []  vomiting blood []  blood in stool  GU: []  CKD/renal failure []  HD--[]  M/W/F or []  T/T/S []  burning with urination []  blood in urine  Psychiatric: []  anxiety []  depression  Musculoskeletal: []  arthritis []  joint pain  Integumentary: []  rashes [x]  ulcers  Constitutional: []  fever []  chills   Physical Examination  Vitals:   05/05/20 0251 05/05/20 0741  BP: 130/72 117/63  Pulse: 70 72  Resp: 18 18  Temp: 98.2 F (36.8 C) 98.5 F (36.9 C)  SpO2: 97% 97%   Body mass index is 25.94  kg/m.  General:  nad HENT: WNL, normocephalic mask in place Pulmonary: normal non-labored breathing Cardiac: Palpable right common femoral, popliteal and weakly palpable right dorsalis pedis arteries Abdomen:  soft, NT/ND, no masses Extremities: Focal ulceration distal tip right great toe Left below-knee amputation well-healed Neurologic: A&O X 3  CBC    Component Value Date/Time   WBC 8.5 05/04/2020 1607   RBC 4.76 05/04/2020 1607   HGB 14.1 05/04/2020 1607   HCT 43.2 05/04/2020 1607   PLT 182 05/04/2020 1607   MCV 90.8 05/04/2020 1607   MCH 29.6 05/04/2020 1607   MCHC 32.6 05/04/2020 1607   RDW 13.8 05/04/2020 1607   LYMPHSABS 1.6  05/04/2020 1607   MONOABS 0.7 05/04/2020 1607   EOSABS 0.3 05/04/2020 1607   BASOSABS 0.1 05/04/2020 1607    BMET    Component Value Date/Time   NA 136 05/04/2020 1607   K 4.2 05/04/2020 1607   CL 102 05/04/2020 1607   CO2 25 05/04/2020 1607   GLUCOSE 180 (H) 05/04/2020 1607   BUN 22 05/04/2020 1607   BUN 18 06/10/2017 0000   CREATININE 0.95 05/04/2020 1607   CALCIUM 8.8 (L) 05/04/2020 1607   GFRNONAA >60 05/04/2020 1607   GFRAA >60 05/04/2020 1607    COAGS: Lab Results  Component Value Date   INR 1.0 01/17/2020     Non-Invasive Vascular Imaging:   MRI reviewed and demonstrates osteomyelitis of tip of Right great toe  ASSESSMENT/PLAN: This is a 69 y.o. male status post right fourth toe amputation which is well-healed.  Now has osteomyelitis of the great toe with ulceration that has not healed despite prolonged antibiotics.  I reviewed his angiography which demonstrates no flow limitation.  He has a palpable dorsalis pedis pulse.  Patient is n.p.o. we will plan for amputation of the right great toe today in the OR.  Tai Skelly C. Randie Heinz, MD [facility] [place] Office: [phone] Pager: [phone]

## 2020-05-05 NOTE — Anesthesia Preprocedure Evaluation (Addendum)
Anesthesia Evaluation  Patient identified by MRN, date of birth, ID band Patient awake    Reviewed: Allergy & Precautions, NPO status , Patient's Chart, lab work & pertinent test results  History of Anesthesia Complications Negative for: history of anesthetic complications  Airway Mallampati: II  TM Distance: >3 FB Neck ROM: Full    Dental  (+) Dental Advisory Given, Missing   Pulmonary neg pulmonary ROS, former smoker,    breath sounds clear to auscultation       Cardiovascular hypertension, Pt. on medications (-) angina+ Peripheral Vascular Disease   Rhythm:Regular Rate:Normal     Neuro/Psych negative neurological ROS     GI/Hepatic negative GI ROS, Neg liver ROS,   Endo/Other  diabetes (glu 143), Insulin Dependent, Oral Hypoglycemic Agents  Renal/GU negative Renal ROS     Musculoskeletal   Abdominal   Peds  Hematology   Anesthesia Other Findings   Reproductive/Obstetrics                            Anesthesia Physical Anesthesia Plan  ASA: III  Anesthesia Plan: Regional and MAC   Post-op Pain Management:    Induction:   PONV Risk Score and Plan: 1  Airway Management Planned: Natural Airway and Simple Face Mask  Additional Equipment:   Intra-op Plan:   Post-operative Plan:   Informed Consent: I have reviewed the patients History and Physical, chart, labs and discussed the procedure including the risks, benefits and alternatives for the proposed anesthesia with the patient or authorized representative who has indicated his/her understanding and acceptance.       Plan Discussed with: CRNA and Surgeon  Anesthesia Plan Comments: (Plan routine monitors, ankle block)        Anesthesia Quick Evaluation

## 2020-05-05 NOTE — Progress Notes (Signed)
Pt belongings retrieved from [facility]. Returned to Pt his bag with cell phone, prothesis, and other personal belongings.

## 2020-05-05 NOTE — Anesthesia Postprocedure Evaluation (Signed)
Anesthesia Post Note  Patient: Donald Duran  Procedure(s) Performed: AMPUTATION RIGHT GREAT TOE (Right Toe)     Patient location during evaluation: PACU Anesthesia Type: Regional Level of consciousness: awake and alert, oriented and patient cooperative Pain management: pain level controlled Vital Signs Assessment: post-procedure vital signs reviewed and stable Respiratory status: spontaneous breathing, nonlabored ventilation and respiratory function stable Cardiovascular status: blood pressure returned to baseline and stable Postop Assessment: no apparent nausea or vomiting Anesthetic complications: no    Last Vitals:  Vitals:   05/05/20 1415 05/05/20 1438  BP: 107/70 112/69  Pulse: 64 68  Resp: 16 16  Temp: 36.5 C 36.7 C  SpO2: 95% 94%    Last Pain:  Vitals:   05/05/20 1438  TempSrc: Oral  PainSc: 0-No pain                 Stepahnie Campo,E. Leasha Goldberger

## 2020-05-05 NOTE — Progress Notes (Addendum)
PROGRESS NOTE    Donald Duran  DZH:[id number] DOB: 24-Dec-1951 DOA: 05/04/2020 PCP: [facility]   Chief Complaint  Patient presents with  . Foot Pain    Brief Narrative HPI per Dr. Mariea Clonts on 05/04/2020.  Donald Duran is a 69 y.o. male with medical history significant for  HTN, DM, peripheral vascular disease, [name], osteomyelitis.  Patient had osteomyelitis involving the same foot-medial and distal phalange of fourth toe, in January 2021, had PICC line placed and was fitted with vancomycin and Rocephin, followed up as an outpatient with vascular surgery, and had right fourth toe amputation, by Dr. Chestine Spore 01/2020, and antibiotics continued through 02/28/2020. He has had this ulcer to the same right foot but this time involving his 1st and 5th toe, for at least 2 months.  As the wound was not healing, he was prescribed clindamycin about 2 weeks ago by the wound care center surgeon.  Followed up with his primary care provider and had x-rays and yesterday had subsequent MRI done, which showed findings consistent with osteomyelitis.  He was referred to the ED. He denies fevers or chills.  No vomiting no loose stools.  ED Course: Stable vitals.  Temperature 98.  WBC 8.5.  Right foot in the ED shows osteomyelitis of fifth and fifth digits-refer to MRI 5/5 for complete description of findings.  Patient was started on IV vancomycin and cefepime. EDP talked to vascular surgeon on-call Dr. Randie Heinz, recommended admission to [facility], will see in consult.  05/05/2020 MRI of the foot showed acute osteomyelitis of the distal 1 cm of the right great toe distal phalanx. Assessment & Plan:   Principal Problem:   Osteomyelitis (HCC) Active Problems:   Type II diabetes mellitus, uncontrolled (HCC)   Essential hypertension, benign   Diabetic foot infection (HCC)   Hx of BKA, left (HCC)    #1 acute osteomyelitis of the right great toe and distal phalanx of the fifth toe failed  outpatient treatment with antibiotics Patient with history of recent right fourth toe osteomyelitis status post amputation in February 2021 after being treated with IV antibiotics with a PICC line in place. History of [name] surgery done in [place]. Seen by vascular surgery recommending and planning for the right great toe amputation. On vancomycin and cefepime for now.  #2 history of essential hypertension on lisinopril  #3 history of type 2 diabetes on Lantus at home along with Metformin.  Continue SSI for now CBG (last 3)  Recent Labs    05/05/20 0741 05/05/20 1034 05/05/20 1355  GLUCAP 143* 143* 129*    #4 peripheral vascular disease continue statin aspirin on hold for surgery  #5 hyperlipidemia on statin   DVT prophylaxis: SCD  code Status: Full code  family Communication: Discussed with patient Disposition: Inpatient status patient admitted with acute osteomyelitis of the right great toe and right fifth toe.  Patient scheduled to have amputation today. Dispo: The patient is from              Anticipated d/c is to:              Anticipated d/c date is:               Patient currently is not medically stable to d/c.    Consultants:  Vascular surgery  Procedures: None Antimicrobials: Vancomycin and cefepime  Subjective:  Patient is resting in bed   Objective: Vitals:   05/05/20 1240 05/05/20 1245 05/05/20 1250 05/05/20 1255  BP:   139/64   Pulse: 68 70 69 66  Resp: 14 13 12  (!) 0  Temp:      TempSrc:      SpO2: 100% 100% 100% 100%  Weight:      Height:        Intake/Output Summary (Last 24 hours) at 05/05/2020 1354 Last data filed at 05/05/2020 1316 Gross per 24 hour  Intake 50 ml  Output --  Net 50 ml   Filed Weights   05/04/20 1456 05/05/20 1118  Weight: 84.4 kg 84.4 kg    Examination:  General exam: Appears calm and comfortable  Respiratory system: Clear to auscultation. Respiratory effort normal. Cardiovascular system: S1 & S2 heard, RRR.  No JVD, murmurs, rubs, gallops or clicks. No pedal edema. Gastrointestinal system: Abdomen is nondistended, soft and nontender. No organomegaly or masses felt. Normal bowel sounds heard. Central nervous system: Alert and oriented. No focal neurological deficits. Extremities: Left below-knee amputation, right great toe ulceration noted. Skin: No rashes, lesions or ulcers Psychiatry: Judgement and insight appear normal. Mood & affect appropriate.     Data Reviewed: I have personally reviewed following labs and imaging studies  CBC: Recent Labs  Lab 05/04/20 1607  WBC 8.5  NEUTROABS 5.8  HGB 14.1  HCT 43.2  MCV 90.8  PLT 182    Basic Metabolic Panel: Recent Labs  Lab 05/04/20 1607  NA 136  K 4.2  CL 102  CO2 25  GLUCOSE 180*  BUN 22  CREATININE 0.95  CALCIUM 8.8*    GFR: Estimated Creatinine Clearance: 80.4 mL/min (by C-G formula based on SCr of 0.95 mg/dL).  Liver Function Tests: Recent Labs  Lab 05/04/20 1607  AST 28  ALT 31  ALKPHOS 45  BILITOT 1.3*  PROT 7.4  ALBUMIN 4.4    CBG: Recent Labs  Lab 05/04/20 2015 05/05/20 0118 05/05/20 0352 05/05/20 0741 05/05/20 1034  GLUCAP 131* 118* 156* 143* 143*     Recent Results (from the past 240 hour(s))  Blood culture (routine x 2)     Status: None (Preliminary result)   Collection Time: 05/04/20  4:10 PM   Specimen: Right Antecubital; Blood  Result Value Ref Range Status   Specimen Description RIGHT ANTECUBITAL  Final   Special Requests   Final    BOTTLES DRAWN AEROBIC AND ANAEROBIC Blood Culture adequate volume   Culture   Final    NO GROWTH < 24 HOURS Performed at [facility], [address] [place], [place]    Report Status PENDING  Incomplete  Respiratory Panel by RT PCR (Flu A&B, Covid) - Nasopharyngeal Swab     Status: None   Collection Time: 05/04/20  4:19 PM   Specimen: Nasopharyngeal Swab  Result Value Ref Range Status   SARS Coronavirus 2 by RT PCR NEGATIVE NEGATIVE Final     Comment: (NOTE) SARS-CoV-2 target nucleic acids are NOT DETECTED. The SARS-CoV-2 RNA is generally detectable in upper respiratoy specimens during the acute phase of infection. The lowest concentration of SARS-CoV-2 viral copies this assay can detect is 131 copies/mL. A negative result does not preclude SARS-Cov-2 infection and should not be used as the sole basis for treatment or other patient management decisions. A negative result may occur with  improper specimen collection/handling, submission of specimen other than nasopharyngeal swab, presence of viral mutation(s) within the areas targeted by this assay, and inadequate number of viral copies (<131 copies/mL). A negative result must be combined with clinical observations, patient  history, and epidemiological information. The expected result is Negative. Fact Sheet for Patients:  PinkCheek.be Fact Sheet for Healthcare Providers:  GravelBags.it This test is not yet ap proved or cleared by the [place] FDA and  has been authorized for detection and/or diagnosis of SARS-CoV-2 by FDA under an Emergency Use Authorization (EUA). This EUA will remain  in effect (meaning this test can be used) for the duration of the COVID-19 declaration under Section 564(b)(1) of the Act, 21 U.S.C. section 360bbb-3(b)(1), unless the authorization is terminated or revoked sooner.    Influenza A by PCR NEGATIVE NEGATIVE Final   Influenza B by PCR NEGATIVE NEGATIVE Final    Comment: (NOTE) The Xpert Xpress SARS-CoV-2/FLU/RSV assay is intended as an aid in  the diagnosis of influenza from Nasopharyngeal swab specimens and  should not be used as a sole basis for treatment. Nasal washings and  aspirates are unacceptable for Xpert Xpress SARS-CoV-2/FLU/RSV  testing. Fact Sheet for Patients: PinkCheek.be Fact Sheet for Healthcare  Providers: GravelBags.it This test is not yet approved or cleared by the [place] FDA and  has been authorized for detection and/or diagnosis of SARS-CoV-2 by  FDA under an Emergency Use Authorization (EUA). This EUA will remain  in effect (meaning this test can be used) for the duration of the  Covid-19 declaration under Section 564(b)(1) of the Act, 21  U.S.C. section 360bbb-3(b)(1), unless the authorization is  terminated or revoked. Performed at [facility], [address], [place], [place] [address]   Blood culture (routine x 2)     Status: None (Preliminary result)   Collection Time: 05/04/20  4:26 PM   Specimen: Right Antecubital; Blood  Result Value Ref Range Status   Specimen Description RIGHT ANTECUBITAL  Final   Special Requests   Final    BOTTLES DRAWN AEROBIC AND ANAEROBIC Blood Culture adequate volume   Culture   Final    NO GROWTH < 24 HOURS Performed at [facility], [address] [place], [place] [address]    Report Status PENDING  Incomplete  Surgical pcr screen     Status: None   Collection Time: 05/05/20 10:07 AM   Specimen: Nasal Mucosa; Nasal Swab  Result Value Ref Range Status   MRSA, PCR NEGATIVE NEGATIVE Final   Staphylococcus aureus NEGATIVE NEGATIVE Final    Comment: (NOTE) The Xpert SA Assay (FDA approved for NASAL specimens in patients 71 years of age and older), is one component of a comprehensive surveillance program. It is not intended to diagnose infection nor to guide or monitor treatment. Performed at [facility], [place] [address] [place], [place] [address]          Radiology Studies: DG Foot Complete Right  Result Date: 05/04/2020 CLINICAL DATA:  Right great toe pain and ulceration, diabetes EXAM: RIGHT FOOT COMPLETE - 3+ VIEW COMPARISON:  05/03/2020, 04/19/2020 FINDINGS: Frontal, oblique, and lateral views of the right foot are obtained. Prior amputation at the fourth proximal phalanx. No acute  displaced fracture. Cortical irregularity of the first and fifth distal phalanges consistent with areas of acute osteomyelitis seen on MRI yesterday. Soft tissue ulceration plantar aspect first digit. Diffuse vascular calcifications. IMPRESSION: 1. Findings compatible with known osteomyelitis of the first and fifth digits. Please refer to MRI performed 05/03/2020 for complete description of findings. Electronically Signed   By: Randa Ngo M.D.   On: 05/04/2020 17:22        Scheduled Meds: . [MAR Hold] atorvastatin  20 mg  Oral Daily  . [MAR Hold] Chlorhexidine Gluconate Cloth  6 each Topical Daily  . [MAR Hold] insulin aspart  0-15 Units Subcutaneous Q4H  . [MAR Hold] lisinopril  20 mg Oral Daily   Continuous Infusions: . ceFAZolin    . lactated ringers 300 mL/hr at 05/05/20 1343     LOS: 1 day      Alwyn Ren, MD [facility]   To contact the attending provider between 7A-7P or the covering provider during after hours 7P-7A, please log into the web site [email or web address] and access using universal [place] password for that web site. If you do not have the password, please call the hospital operator.  05/05/2020, 1:54 PM

## 2020-05-05 NOTE — ED Notes (Signed)
Pt informed of carelink on the way

## 2020-05-05 NOTE — Anesthesia Procedure Notes (Signed)
Procedure Name: MAC Date/Time: 05/05/2020 1:11 PM Performed by: Imagene Riches, CRNA Pre-anesthesia Checklist: Patient identified, Emergency Drugs available, Suction available, Patient being monitored and Timeout performed Patient Re-evaluated:Patient Re-evaluated prior to induction Oxygen Delivery Method: Simple face mask

## 2020-05-05 NOTE — ED Notes (Signed)
Pt applied prosthetic and ambulated around unit

## 2020-05-06 LAB — GLUCOSE, CAPILLARY
Glucose-Capillary: 104 mg/dL — ABNORMAL HIGH (ref 70–99)
Glucose-Capillary: 148 mg/dL — ABNORMAL HIGH (ref 70–99)
Glucose-Capillary: 154 mg/dL — ABNORMAL HIGH (ref 70–99)
Glucose-Capillary: 185 mg/dL — ABNORMAL HIGH (ref 70–99)

## 2020-05-06 LAB — COMPREHENSIVE METABOLIC PANEL
ALT: 25 U/L (ref 0–44)
AST: 28 U/L (ref 15–41)
Albumin: 3.5 g/dL (ref 3.5–5.0)
Alkaline Phosphatase: 39 U/L (ref 38–126)
Anion gap: 8 (ref 5–15)
BUN: 14 mg/dL (ref 8–23)
CO2: 25 mmol/L (ref 22–32)
Calcium: 8.6 mg/dL — ABNORMAL LOW (ref 8.9–10.3)
Chloride: 103 mmol/L (ref 98–111)
Creatinine, Ser: 0.86 mg/dL (ref 0.61–1.24)
GFR calc Af Amer: >60 mL/min (ref >60)
GFR calc non Af Amer: >60 mL/min (ref >60)
Glucose, Bld: 176 mg/dL — ABNORMAL HIGH (ref 70–99)
Potassium: 4.4 mmol/L (ref 3.5–5.1)
Sodium: 136 mmol/L (ref 135–145)
Total Bilirubin: 1 mg/dL (ref 0.3–1.2)
Total Protein: 6.4 g/dL — ABNORMAL LOW (ref 6.5–8.1)

## 2020-05-06 LAB — CBC
HCT: 40.4 % (ref 39.0–52.0)
Hemoglobin: 13.2 g/dL (ref 13.0–17.0)
MCH: 29.4 pg (ref 26.0–34.0)
MCHC: 32.7 g/dL (ref 30.0–36.0)
MCV: 90 fL (ref 80.0–100.0)
Platelets: 170 10*3/uL (ref 150–400)
RBC: 4.49 MIL/uL (ref 4.22–5.81)
RDW: 13.5 % (ref 11.5–15.5)
WBC: 8.2 10*3/uL (ref 4.0–10.5)
nRBC: 0 % (ref 0.0–0.2)

## 2020-05-06 MED ORDER — AMOXICILLIN-POT CLAVULANATE 875-125 MG PO TABS
1.0000 | ORAL_TABLET | Freq: Two times a day (BID) | ORAL | 0 refills | Status: AC
Start: 2020-05-06 — End: 2020-06-03

## 2020-05-06 MED ORDER — SACCHAROMYCES BOULARDII 250 MG PO CAPS
250.0000 mg | ORAL_CAPSULE | Freq: Two times a day (BID) | ORAL | 2 refills | Status: DC
Start: 2020-05-06 — End: 2020-12-15

## 2020-05-06 MED ORDER — LISINOPRIL 5 MG PO TABS: 5.0000 mg | ORAL_TABLET | Freq: Every day | ORAL | 2 refills | Status: DC

## 2020-05-06 NOTE — Plan of Care (Signed)

## 2020-05-06 NOTE — Discharge Summary (Signed)
Physician Discharge Summary  Donald Duran BOF:[id number] DOB: May 17, 1951 DOA: 05/04/2020  PCP: Donald Duran [facility]  Admit date: 05/04/2020 Discharge date: 05/06/2020  Admitted From: Home Disposition: Home  Recommendations for Outpatient Follow-up:  1. Follow up with PCP in 1-2 weeks 2. Please obtain BMP/CBC in one week 3. Please follow up with dr Duran   Home Health none  equipment/Devices: None Discharge Condition: Stable and improved  CODE STATUS full code Diet recommendation: Cardiac diet carb modified Brief/Interim Summary:HPI per Dr. Mariea Duran on 05/04/2020.  Donald Duran a 69 y.o.malewith medical history significant forHTN, DM,peripheral vascular disease, [name], osteomyelitis. Patient had osteomyelitis involving the same foot-medial and distal phalange of fourth toe,in January 2021,had PICC line placed and was fitted with vancomycin and Rocephin,followed up as an outpatient with vascular surgery, and had right fourth toe amputation,by Dr. Jeralyn Duran antibiotics continued through 02/28/2020. Hehas had thisulcer to the same right foot but this time involving his 1st and 5th toe,for at least 2 months. As the wound was not healing, he was prescribed clindamycin about 2 weeks ago by the wound care center surgeon. Followed up with his primary care provider and had x-rays and yesterday had subsequent MRI done,which showed findings consistent with osteomyelitis.He was referred to the ED. He denies fevers or chills.No vomiting no loose stools.  ED Course:Stable vitals. Temperature 98. WBC 8.5. Right foot in the ED shows osteomyelitis of fifth and fifth digits-refer to MRI 5/5 for complete description of findings. Patient was started on IV vancomycin and cefepime. EDPtalked to vascular surgeon on-call Dr.Cain,recommended admission to [facility], will see in consult.   Discharge Diagnoses:  Principal Problem:   Osteomyelitis  (HCC) Active Problems:   Type II diabetes mellitus, uncontrolled (HCC)   Essential hypertension, benign   Diabetic foot infection (HCC)   Hx of BKA, left (HCC)  #1 acute osteomyelitis of the right great toe and distal phalanx of the fifth toe failed outpatient treatment with antibiotics. Patient with history of recent right fourth toe osteomyelitis status post amputation in February 2021 after being treated with IV antibiotics with a PICC line in place. History of [name] surgery done in [place]. Status post right great toe amputation 05/05/2020.  He will be discharged home on Augmentin twice a day for 4 weeks.  He will follow up with Dr. Arbie Duran as an outpatient.  #2 history of essential hypertension on lisinopril dose decreased to 2.5 mg daily due to soft blood pressure.  #3 history of type 2 diabetes continue Metformin and Lantus.    #4 peripheral vascular disease continue aspirin and statin.    #5 hyperlipidemia on statin  Discharge Instructions  Discharge Instructions    Diet - low sodium heart healthy   Complete by: As directed    Increase activity slowly   Complete by: As directed      Allergies as of 05/06/2020   No Known Allergies     Medication List    STOP taking these medications   clindamycin 300 MG capsule Commonly known as: CLEOCIN   HYDROcodone-acetaminophen 5-325 MG tablet Commonly known as: NORCO/VICODIN     TAKE these medications   amoxicillin-clavulanate 875-125 MG tablet Commonly known as: Augmentin Take 1 tablet by mouth 2 (two) times daily for 28 days.   aspirin EC 81 MG tablet Take 81 mg by mouth at bedtime.   atorvastatin 20 MG tablet Commonly known as: LIPITOR Take 20 mg by mouth daily.   Basaglar KwikPen 100 UNIT/ML Inject 40 Units into  the skin at bedtime.   cholecalciferol 25 MCG (1000 UNIT) tablet Commonly known as: VITAMIN D3 Take 2,000 Units by mouth daily.   CoQ10 50 MG Caps Take 50 mg by mouth once a week.   glucose  blood test strip Use as instructed   lisinopril 5 MG tablet Commonly known as: ZESTRIL Take 1 tablet (5 mg total) by mouth daily. Start taking on: May 07, 2020 What changed:   medication strength  how much to take   metFORMIN 500 MG tablet Commonly known as: GLUCOPHAGE Take 1 tablet (500 mg total) by mouth 2 (two) times daily with a meal.   multivitamin with minerals Tabs tablet Take 1 tablet by mouth daily.   QC Tumeric Complex 500 MG Caps Generic drug: Turmeric Take 500 mg by mouth daily.   saccharomyces boulardii 250 MG capsule Commonly known as: Florastor Take 1 capsule (250 mg total) by mouth 2 (two) times daily.   vitamin C 1000 MG tablet Take 1,000 mg by mouth every morning.      Follow-up Information    [facility]   Contact information: [address] [place] [address] [phone]        Donald EarthlyEarly, Donald F, MD Follow up.   Specialties: Vascular Surgery, Cardiology Contact information: [address] [place] [address] [phone]          No Known Allergies  Consultations:  Vascular surgery   Procedures/Studies: DG Foot Complete Right  Result Date: 05/04/2020 CLINICAL DATA:  Right great toe pain and ulceration, diabetes EXAM: RIGHT FOOT COMPLETE - 3+ VIEW COMPARISON:  05/03/2020, 04/19/2020 FINDINGS: Frontal, oblique, and lateral views of the right foot are obtained. Prior amputation at the fourth proximal phalanx. No acute displaced fracture. Cortical irregularity of the first and fifth distal phalanges consistent with areas of acute osteomyelitis seen on MRI yesterday. Soft tissue ulceration plantar aspect first digit. Diffuse vascular calcifications. IMPRESSION: 1. Findings compatible with known osteomyelitis of the first and fifth digits. Please refer to MRI performed 05/03/2020 for complete description of findings. Electronically Signed   By: Sharlet SalinaMichael  Duran M.D.   On: 05/04/2020 17:22    (Echo, Carotid,  EGD, Colonoscopy, ERCP)    Subjective:  Patient resting in bed no new complaints  Discharge Exam: Vitals:   05/06/20 0459 05/06/20 0829  BP: 136/65 129/68  Pulse: 69 71  Resp: 16 17  Temp: 98.7 F (37.1 C) 98.6 F (37 C)  SpO2: 97% 94%   Vitals:   05/05/20 1438 05/05/20 2000 05/06/20 0459 05/06/20 0829  BP: 112/69 117/67 136/65 129/68  Pulse: 68 75 69 71  Resp: 16 15 16 17   Temp: 98 F (36.7 C) 98.3 F (36.8 C) 98.7 F (37.1 C) 98.6 F (37 C)  TempSrc: Oral Oral Oral Oral  SpO2: 94% 92% 97% 94%  Weight:      Height:        General: Pt is alert, awake, not in acute distress Cardiovascular: RRR, S1/S2 +, no rubs, no gallops Respiratory: CTA bilaterally, no wheezing, no rhonchi Abdominal: Soft, NT, ND, bowel sounds + Extremities: Right foot covered with dressings [name]  The results of significant diagnostics from this hospitalization (including imaging, microbiology, ancillary and laboratory) are listed below for reference.     Microbiology: Recent Results (from the past 240 hour(s))  Blood culture (routine x 2)     Status: None (Preliminary result)   Collection Time: 05/04/20  4:10 PM   Specimen: Right  Antecubital; Blood  Result Value Ref Range Status   Specimen Description RIGHT ANTECUBITAL  Final   Special Requests   Final    BOTTLES DRAWN AEROBIC AND ANAEROBIC Blood Culture adequate volume   Culture   Final    NO GROWTH 2 DAYS Performed at [facility], [address] [place], [place] [address]    Report Status PENDING  Incomplete  Respiratory Panel by RT PCR (Flu A&B, Covid) - Nasopharyngeal Swab     Status: None   Collection Time: 05/04/20  4:19 PM   Specimen: Nasopharyngeal Swab  Result Value Ref Range Status   SARS Coronavirus 2 by RT PCR NEGATIVE NEGATIVE Final    Comment: (NOTE) SARS-CoV-2 target nucleic acids are NOT DETECTED. The SARS-CoV-2 RNA is generally detectable in upper respiratoy specimens during the acute phase of infection. The  lowest concentration of SARS-CoV-2 viral copies this assay can detect is 131 copies/mL. A negative result does not preclude SARS-Cov-2 infection and should not be used as the sole basis for treatment or other patient management decisions. A negative result may occur with  improper specimen collection/handling, submission of specimen other than nasopharyngeal swab, presence of viral mutation(s) within the areas targeted by this assay, and inadequate number of viral copies (<131 copies/mL). A negative result must be combined with clinical observations, patient history, and epidemiological information. The expected result is Negative. Fact Sheet for Patients:  https://www.moore.com/ Fact Sheet for Healthcare Providers:  https://www.young.biz/ This test is not yet ap proved or cleared by the [place] FDA and  has been authorized for detection and/or diagnosis of SARS-CoV-2 by FDA under an Emergency Use Authorization (EUA). This EUA will remain  in effect (meaning this test can be used) for the duration of the COVID-19 declaration under Section 564(b)(1) of the Act, 21 U.S.C. section 360bbb-3(b)(1), unless the authorization is terminated or revoked sooner.    Influenza A by PCR NEGATIVE NEGATIVE Final   Influenza B by PCR NEGATIVE NEGATIVE Final    Comment: (NOTE) The Xpert Xpress SARS-CoV-2/FLU/RSV assay is intended as an aid in  the diagnosis of influenza from Nasopharyngeal swab specimens and  should not be used as a sole basis for treatment. Nasal washings and  aspirates are unacceptable for Xpert Xpress SARS-CoV-2/FLU/RSV  testing. Fact Sheet for Patients: https://www.moore.com/ Fact Sheet for Healthcare Providers: https://www.young.biz/ This test is not yet approved or cleared by the [place] FDA and  has been authorized for detection and/or diagnosis of SARS-CoV-2 by  FDA under an Emergency  Use Authorization (EUA). This EUA will remain  in effect (meaning this test can be used) for the duration of the  Covid-19 declaration under Section 564(b)(1) of the Act, 21  U.S.C. section 360bbb-3(b)(1), unless the authorization is  terminated or revoked. Performed at [facility], [address], [place], [place] [address]   Blood culture (routine x 2)     Status: None (Preliminary result)   Collection Time: 05/04/20  4:26 PM   Specimen: Right Antecubital; Blood  Result Value Ref Range Status   Specimen Description RIGHT ANTECUBITAL  Final   Special Requests   Final    BOTTLES DRAWN AEROBIC AND ANAEROBIC Blood Culture adequate volume   Culture   Final    NO GROWTH 2 DAYS Performed at [facility], [address] [place], [place] [address]    Report Status PENDING  Incomplete  Surgical pcr screen     Status: None   Collection Time: 05/05/20 10:07 AM   Specimen:  Nasal Mucosa; Nasal Swab  Result Value Ref Range Status   MRSA, PCR NEGATIVE NEGATIVE Final   Staphylococcus aureus NEGATIVE NEGATIVE Final    Comment: (NOTE) The Xpert SA Assay (FDA approved for NASAL specimens in patients 30 years of age and older), is one component of a comprehensive surveillance program. It is not intended to diagnose infection nor to guide or monitor treatment. Performed at [facility], [address] [place], [place] [address]      Labs: BNP (last 3 results) No results for input(s): BNP in the last 8760 hours. Basic Metabolic Panel: Recent Labs  Lab 05/04/20 1607 05/06/20 0833  NA 136 136  K 4.2 4.4  CL 102 103  CO2 25 25  GLUCOSE 180* 176*  BUN 22 14  CREATININE 0.95 0.86  CALCIUM 8.8* 8.6*   Liver Function Tests: Recent Labs  Lab 05/04/20 1607 05/06/20 0833  AST 28 28  ALT 31 25  ALKPHOS 45 39  BILITOT 1.3* 1.0  PROT 7.4 6.4*  ALBUMIN 4.4 3.5   No results for input(s): LIPASE, AMYLASE in the last 168 hours. No results for input(s): AMMONIA in the last 168  hours. CBC: Recent Labs  Lab 05/04/20 1607 05/06/20 0833  WBC 8.5 8.2  NEUTROABS 5.8  --   HGB 14.1 13.2  HCT 43.2 40.4  MCV 90.8 90.0  PLT 182 170   Cardiac Enzymes: No results for input(s): CKTOTAL, CKMB, CKMBINDEX, TROPONINI in the last 168 hours. BNP: Invalid input(s): POCBNP CBG: Recent Labs  Lab 05/05/20 1957 05/06/20 0041 05/06/20 0456 05/06/20 0759 05/06/20 1113  GLUCAP 133* 104* 148* 154* 185*   D-Dimer No results for input(s): DDIMER in the last 72 hours. Hgb A1c No results for input(s): HGBA1C in the last 72 hours. Lipid Profile No results for input(s): CHOL, HDL, LDLCALC, TRIG, CHOLHDL, LDLDIRECT in the last 72 hours. Thyroid function studies No results for input(s): TSH, T4TOTAL, T3FREE, THYROIDAB in the last 72 hours.  Invalid input(s): FREET3 Anemia work up No results for input(s): VITAMINB12, FOLATE, FERRITIN, TIBC, IRON, RETICCTPCT in the last 72 hours. Urinalysis No results found for: COLORURINE, APPEARANCEUR, LABSPEC, PHURINE, GLUCOSEU, HGBUR, BILIRUBINUR, KETONESUR, PROTEINUR, UROBILINOGEN, NITRITE, LEUKOCYTESUR Sepsis Labs Invalid input(s): PROCALCITONIN,  WBC,  LACTICIDVEN Microbiology Recent Results (from the past 240 hour(s))  Blood culture (routine x 2)     Status: None (Preliminary result)   Collection Time: 05/04/20  4:10 PM   Specimen: Right Antecubital; Blood  Result Value Ref Range Status   Specimen Description RIGHT ANTECUBITAL  Final   Special Requests   Final    BOTTLES DRAWN AEROBIC AND ANAEROBIC Blood Culture adequate volume   Culture   Final    NO GROWTH 2 DAYS Performed at [facility], [address] [place], [place] [address]    Report Status PENDING  Incomplete  Respiratory Panel by RT PCR (Flu A&B, Covid) - Nasopharyngeal Swab     Status: None   Collection Time: 05/04/20  4:19 PM   Specimen: Nasopharyngeal Swab  Result Value Ref Range Status   SARS Coronavirus 2 by RT PCR NEGATIVE NEGATIVE Final    Comment:  (NOTE) SARS-CoV-2 target nucleic acids are NOT DETECTED. The SARS-CoV-2 RNA is generally detectable in upper respiratoy specimens during the acute phase of infection. The lowest concentration of SARS-CoV-2 viral copies this assay can detect is 131 copies/mL. A negative result does not preclude SARS-Cov-2 infection and should not be used as the sole basis for treatment or  other patient management decisions. A negative result may occur with  improper specimen collection/handling, submission of specimen other than nasopharyngeal swab, presence of viral mutation(s) within the areas targeted by this assay, and inadequate number of viral copies (<131 copies/mL). A negative result must be combined with clinical observations, patient history, and epidemiological information. The expected result is Negative. Fact Sheet for Patients:  PinkCheek.be Fact Sheet for Healthcare Providers:  GravelBags.it This test is not yet ap proved or cleared by the [place] FDA and  has been authorized for detection and/or diagnosis of SARS-CoV-2 by FDA under an Emergency Use Authorization (EUA). This EUA will remain  in effect (meaning this test can be used) for the duration of the COVID-19 declaration under Section 564(b)(1) of the Act, 21 U.S.C. section 360bbb-3(b)(1), unless the authorization is terminated or revoked sooner.    Influenza A by PCR NEGATIVE NEGATIVE Final   Influenza B by PCR NEGATIVE NEGATIVE Final    Comment: (NOTE) The Xpert Xpress SARS-CoV-2/FLU/RSV assay is intended as an aid in  the diagnosis of influenza from Nasopharyngeal swab specimens and  should not be used as a sole basis for treatment. Nasal washings and  aspirates are unacceptable for Xpert Xpress SARS-CoV-2/FLU/RSV  testing. Fact Sheet for Patients: PinkCheek.be Fact Sheet for Healthcare  Providers: GravelBags.it This test is not yet approved or cleared by the [place] FDA and  has been authorized for detection and/or diagnosis of SARS-CoV-2 by  FDA under an Emergency Use Authorization (EUA). This EUA will remain  in effect (meaning this test can be used) for the duration of the  Covid-19 declaration under Section 564(b)(1) of the Act, 21  U.S.C. section 360bbb-3(b)(1), unless the authorization is  terminated or revoked. Performed at [facility], [address] [place], [place] [address]   Blood culture (routine x 2)     Status: None (Preliminary result)   Collection Time: 05/04/20  4:26 PM   Specimen: Right Antecubital; Blood  Result Value Ref Range Status   Specimen Description RIGHT ANTECUBITAL  Final   Special Requests   Final    BOTTLES DRAWN AEROBIC AND ANAEROBIC Blood Culture adequate volume   Culture   Final    NO GROWTH 2 DAYS Performed at [facility], [address] [place], [place] [address]    Report Status PENDING  Incomplete  Surgical pcr screen     Status: None   Collection Time: 05/05/20 10:07 AM   Specimen: Nasal Mucosa; Nasal Swab  Result Value Ref Range Status   MRSA, PCR NEGATIVE NEGATIVE Final   Staphylococcus aureus NEGATIVE NEGATIVE Final    Comment: (NOTE) The Xpert SA Assay (FDA approved for NASAL specimens in patients 65 years of age and older), is one component of a comprehensive surveillance program. It is not intended to diagnose infection nor to guide or monitor treatment. Performed at [facility], [place] [address] [place], [place] [address]      Time coordinating discharge:  38 minutes  SIGNED:   Georgette Shell, MD  [facility] 05/06/2020, 12:22 PM Pager   If 7PM-7AM, please contact night-coverage [email or web address] Password [email or web address]

## 2020-05-06 NOTE — Plan of Care (Signed)
  Problem: Education: Goal: Knowledge of General Education information will improve Description: Including pain rating scale, medication(s)/side effects and non-pharmacologic comfort measures 05/06/2020 1502 by Loma Sousa, RN Outcome: Adequate for Discharge 05/06/2020 0756 by Loma Sousa, RN Outcome: Progressing   Problem: Education: Goal: Knowledge of General Education information will improve Description: Including pain rating scale, medication(s)/side effects and non-pharmacologic comfort measures 05/06/2020 1502 by Loma Sousa, RN Outcome: Adequate for Discharge 05/06/2020 0756 by Loma Sousa, RN Outcome: Progressing   Problem: Health Behavior/Discharge Planning: Goal: Ability to manage health-related needs will improve 05/06/2020 1502 by Loma Sousa, RN Outcome: Adequate for Discharge 05/06/2020 0756 by Loma Sousa, RN Outcome: Progressing   Problem: Clinical Measurements: Goal: Ability to maintain clinical measurements within normal limits will improve Outcome: Adequate for Discharge Goal: Will remain free from infection Outcome: Adequate for Discharge Goal: Diagnostic test results will improve Outcome: Adequate for Discharge Goal: Respiratory complications will improve Outcome: Adequate for Discharge Goal: Cardiovascular complication will be avoided Outcome: Adequate for Discharge   Problem: Activity: Goal: Risk for activity intolerance will decrease Outcome: Adequate for Discharge   Problem: Nutrition: Goal: Adequate nutrition will be maintained Outcome: Adequate for Discharge   Problem: Coping: Goal: Level of anxiety will decrease Outcome: Adequate for Discharge   Problem: Elimination: Goal: Will not experience complications related to bowel motility Outcome: Adequate for Discharge Goal: Will not experience complications related to urinary retention Outcome: Adequate for Discharge   Problem: Pain Managment: Goal: General  experience of comfort will improve 05/06/2020 1502 by Loma Sousa, RN Outcome: Adequate for Discharge 05/06/2020 0756 by Loma Sousa, RN Outcome: Progressing   Problem: Safety: Goal: Ability to remain free from injury will improve 05/06/2020 1502 by Loma Sousa, RN Outcome: Adequate for Discharge 05/06/2020 0756 by Loma Sousa, RN Outcome: Progressing   Problem: Skin Integrity: Goal: Risk for impaired skin integrity will decrease Outcome: Adequate for Discharge

## 2020-05-06 NOTE — Progress Notes (Signed)
D/C instructions given, verbalized understanding. D/C via W/C

## 2020-05-08 ENCOUNTER — Ambulatory Visit: Payer: Medicare Other | Admitting: Internal Medicine

## 2020-05-09 LAB — CULTURE, BLOOD (ROUTINE X 2)
Culture: NO GROWTH
Culture: NO GROWTH
Special Requests: ADEQUATE
Special Requests: ADEQUATE

## 2020-05-23 ENCOUNTER — Telehealth (HOSPITAL_COMMUNITY): Payer: Self-pay

## 2020-05-23 NOTE — Telephone Encounter (Signed)

## 2020-05-24 ENCOUNTER — Other Ambulatory Visit: Payer: Self-pay

## 2020-05-24 ENCOUNTER — Ambulatory Visit (INDEPENDENT_AMBULATORY_CARE_PROVIDER_SITE_OTHER): Payer: Medicare Other | Admitting: Physician Assistant

## 2020-05-24 VITALS — BP 103/63 | HR 82 | Temp 97.1°F | Resp 16 | Ht 71.0 in | Wt 186.0 lb

## 2020-05-24 DIAGNOSIS — S98131A Complete traumatic amputation of one right lesser toe, initial encounter: Secondary | ICD-10-CM

## 2020-05-24 NOTE — Progress Notes (Signed)
  POST OPERATIVE OFFICE NOTE    CC:  F/u for surgery  HPI:  This is a 69 y.o. male who is s/p Right tip of GT amputation.  He denise erythema, fever or chills.  Had had some bloody drainage last seen 3 days ago.   History of [name].  He states the stump is thinning and cause skin irritation over the tibial bony prominence.    He is on asa, Lipitor and monitoring his blood glucose closely.    No Known Allergies  Current Outpatient Medications  Medication Sig Dispense Refill  . amoxicillin-clavulanate (AUGMENTIN) 875-125 MG tablet Take 1 tablet by mouth 2 (two) times daily for 28 days. 56 tablet 0  . Ascorbic Acid (VITAMIN C) 1000 MG tablet Take 1,000 mg by mouth every morning.     Marland Kitchen aspirin EC 81 MG tablet Take 81 mg by mouth at bedtime.     Marland Kitchen atorvastatin (LIPITOR) 20 MG tablet Take 20 mg by mouth daily.    . cholecalciferol (VITAMIN D3) 25 MCG (1000 UNIT) tablet Take 2,000 Units by mouth daily.    . Coenzyme Q10 (COQ10) 50 MG CAPS Take 50 mg by mouth once a week.     Marland Kitchen glucose blood test strip Use as instructed 100 each 1  . Insulin Glargine (BASAGLAR KWIKPEN) 100 UNIT/ML Inject 40 Units into the skin at bedtime.     Marland Kitchen lisinopril (ZESTRIL) 5 MG tablet Take 1 tablet (5 mg total) by mouth daily. 30 tablet 2  . metFORMIN (GLUCOPHAGE) 500 MG tablet Take 1 tablet (500 mg total) by mouth 2 (two) times daily with a meal.    . Multiple Vitamin (MULTIVITAMIN WITH MINERALS) TABS tablet Take 1 tablet by mouth daily.    Marland Kitchen saccharomyces boulardii (FLORASTOR) 250 MG capsule Take 1 capsule (250 mg total) by mouth 2 (two) times daily. 60 capsule 2  . Turmeric (QC TUMERIC COMPLEX) 500 MG CAPS Take 500 mg by mouth daily.      No current facility-administered medications for this visit.     ROS:  See HPI  Physical Exam: Today's Vitals   05/24/20 0938  BP: 103/63  Pulse: 82  Resp: 16  Temp: (!) 97.1 F (36.2 C)  TempSrc: Oral  SpO2: 95%  Weight: 186 lb (84.4 kg)  Height: 5\' 11"  (1.803 m)     Body mass index is 25.94 kg/m.    Incision:  Well healed right GT tip amputation site.  Sutures were removed, patient tolerated this well. Extremities:  Good cap refill residual toes on the right foot.  Second toe nail black with cap refill intact.  No other indication for ischemia.   [name] stump with minimal erythema, no frank opening in skin.  Thinning skin over tibia bone. Neuro: peripheral neuropathy Lungs:  None labored breathing  Assessment/Plan:  This is a 69 y.o. male who is s/p: Right GT toe tip amputation  Well healed Right GT.  He will f/u in 6-9 months for repeat ABI.  I am sending him to [facility] for revision of his prothesis [name] due to skin irritation.     79 PA-C [facility] [phone]  Clinic MD:  [phone]

## 2020-05-25 ENCOUNTER — Other Ambulatory Visit: Payer: Self-pay | Admitting: *Deleted

## 2020-05-25 DIAGNOSIS — S98131A Complete traumatic amputation of one right lesser toe, initial encounter: Secondary | ICD-10-CM

## 2020-06-05 ENCOUNTER — Encounter: Payer: Self-pay | Admitting: Internal Medicine

## 2020-08-14 ENCOUNTER — Ambulatory Visit: Payer: Medicare Other

## 2020-09-19 ENCOUNTER — Ambulatory Visit: Payer: Medicare Other

## 2020-11-07 ENCOUNTER — Other Ambulatory Visit: Payer: Self-pay

## 2020-11-07 ENCOUNTER — Ambulatory Visit (INDEPENDENT_AMBULATORY_CARE_PROVIDER_SITE_OTHER): Payer: Self-pay | Admitting: *Deleted

## 2020-11-07 VITALS — Ht 71.0 in | Wt 193.4 lb

## 2020-11-07 DIAGNOSIS — Z1211 Encounter for screening for malignant neoplasm of colon: Secondary | ICD-10-CM

## 2020-11-07 MED ORDER — PEG 3350-KCL-NA BICARB-NACL 420 G PO SOLR
4000.0000 mL | Freq: Once | ORAL | 0 refills | Status: AC
Start: 2020-11-07 — End: 2020-11-07

## 2020-11-07 NOTE — Progress Notes (Signed)
Gastroenterology Pre-Procedure Review  Request Date: 11/07/2020 Requesting Physician: Dr. Waldon Reining, no previous TCS  PATIENT REVIEW QUESTIONS: The patient responded to the following health history questions as indicated:    1. Diabetes Melitis: yes, type II 2. Joint replacements in the past 12 months: 1st and 4th digit on right foot removed but no replacement Feb 2021 and Apr 2021 3. Major health problems in the past 3 months: yes, cellulitis but better now 4. Has an artificial valve or MVP: no 5. Has a defibrillator: no 6. Has been advised in past to take antibiotics in advance of a procedure like teeth cleaning: no 7. Family history of colon cancer:  no 8. Alcohol Use: yes, occasional beer 1 a month 9. Illicit drug Use: no 10. History of sleep apnea:  no 11. History of coronary artery or other vascular stents placed within the last 12 months: no 12. History of any prior anesthesia complications: no 13. Body mass index is 26.97 kg/m.    MEDICATIONS & ALLERGIES:    Patient reports the following regarding taking any blood thinners:   Plavix? no Aspirin? yes Coumadin? no Brilinta? no Xarelto? no Eliquis? no Pradaxa? no Savaysa? no Effient? no  Patient confirms/reports the following medications:  Current Outpatient Medications  Medication Sig Dispense Refill  . Ascorbic Acid (VITAMIN C) 1000 MG tablet Take 1,000 mg by mouth every morning.     Marland Kitchen aspirin EC 81 MG tablet Take 81 mg by mouth at bedtime.     Marland Kitchen atorvastatin (LIPITOR) 20 MG tablet Take 20 mg by mouth daily.    . cholecalciferol (VITAMIN D3) 25 MCG (1000 UNIT) tablet Take 2,000 Units by mouth daily.    . Coenzyme Q10 (COQ10) 50 MG CAPS Take 50 mg by mouth once a week.     Marland Kitchen glucose blood test strip Use as instructed 100 each 1  . Insulin Glargine (BASAGLAR KWIKPEN) 100 UNIT/ML Inject 40 Units into the skin at bedtime.     Marland Kitchen lisinopril (ZESTRIL) 5 MG tablet Take 1 tablet (5 mg total) by mouth daily. 30 tablet 2   . metFORMIN (GLUCOPHAGE) 500 MG tablet Take 1 tablet (500 mg total) by mouth 2 (two) times daily with a meal.    . Multiple Vitamin (MULTIVITAMIN WITH MINERALS) TABS tablet Take 1 tablet by mouth daily.    Marland Kitchen saccharomyces boulardii (FLORASTOR) 250 MG capsule Take 1 capsule (250 mg total) by mouth 2 (two) times daily. 60 capsule 2  . Turmeric (QC TUMERIC COMPLEX) 500 MG CAPS Take 500 mg by mouth daily.      No current facility-administered medications for this visit.    Patient confirms/reports the following allergies:  No Known Allergies  No orders of the defined types were placed in this encounter.   AUTHORIZATION INFORMATION Primary Insurance: Medicare,  ID #: [id number] Pre-Cert / Auth required: No, not required  Secondary Insurance: [employer],  ID #:[id number] Pre-Cert / Auth required: No, not required  SCHEDULE INFORMATION: Procedure has been scheduled as follows:  Date: 12/15/2020, Time: 12:15 Location: [facility] with Dr. Marletta Lor  This Gastroenterology Pre-Precedure Review Form is being routed to the following provider(s): Wynne Dust, NP

## 2020-11-07 NOTE — Patient Instructions (Addendum)
Donald Duran   03-12-1951 MRN: [id number]    Procedure Date: 12/15/2020 Time to register: 10:45 am Place to register: [facility] Procedure Time: 12:15 pm Scheduled provider: Dr. Abbey Chatters  PREPARATION FOR COLONOSCOPY WITH TRI-LYTE SPLIT PREP  Please notify us immediately if you are diabetic, take iron supplements, or if you are on Coumadin or any other blood thinners.   Please hold the following medications: See letter  You will need to purchase 1 fleet enema and 1 box of Bisacodyl 54m tablets.   2 DAYS BEFORE PROCEDURE:  DATE: 12/13/2020   DAY: Wednesday Begin clear liquid diet AFTER your lunch meal. NO SOLID FOODS after this point.  1 DAY BEFORE PROCEDURE:  DATE: 12/14/2020  DAY: Thursday Continue clear liquids the entire day - NO SOLID FOOD.   Diabetic medications adjustments for today: See letter  At 2:00 pm:  Take 2 Bisacodyl tablets.   At 4:00pm:  Start drinking your solution. Make sure you mix well per instructions on the bottle. Try to drink 1 (one) 8 ounce glass every 10-15 minutes until you have consumed HALF the jug. You should complete by 6:00pm.You must keep the left over solution refrigerated until completed next day.  Continue clear liquids. You must drink plenty of clear liquids to prevent dehyration and kidney failure.     DAY OF PROCEDURE:   DATE: 12/15/2020   DAY: Friday If you take medications for your heart, blood pressure or breathing, you may take these medications.  Diabetic medications adjustments for today: See letter  Five hours before your procedure time @ 7:15 am:  Finish remaining amout of bowel prep, drinking 1 (one) 8 ounce glass every 10-15 minutes until complete. You have two hours to consume remaining prep.   Three hours before your procedure time @ 9:15 am:  Nothing by mouth.   At least one hour before going to the hospital:  Give yourself one Fleet enema. You may take your morning medications with sip of water unless we  have instructed otherwise.      Please see below for Dietary Information.  CLEAR LIQUIDS INCLUDE:  Water Jello (NOT red in color)   Ice Popsicles (NOT red in color)   Tea (sugar ok, no milk/cream) Powdered fruit flavored drinks  Coffee (sugar ok, no milk/cream) Gatorade/ Lemonade/ Kool-Aid  (NOT red in color)   Juice: apple, white grape, white cranberry Soft drinks  Clear bullion, consomme, broth (fat free beef/chicken/vegetable)  Carbonated beverages (any kind)  Strained chicken noodle soup Hard Candy   Remember: Clear liquids are liquids that will allow you to see your fingers on the other side of a clear glass. Be sure liquids are NOT red in color, and not cloudy, but CLEAR.  DO NOT EAT OR DRINK ANY OF THE FOLLOWING:  Dairy products of any kind   Cranberry juice Tomato juice / V8 juice   Grapefruit juice Orange juice     Red grape juice  Do not eat any solid foods, including such foods as: cereal, oatmeal, yogurt, fruits, vegetables, creamed soups, eggs, bread, crackers, pureed foods in a blender, etc.   HELPFUL HINTS FOR DRINKING PREP SOLUTION:   Make sure prep is extremely cold. Mix and refrigerate the the morning of the prep. You may also put in the freezer.   You may try mixing some Crystal Light or Country Time Lemonade if you prefer. Mix in small amounts; add more if necessary.  Try drinking through a straw  Rinse mouth  with water or a mouthwash between glasses, to remove after-taste.  Try sipping on a cold beverage /ice/ popsicles between glasses of prep.  Place a piece of sugar-free hard candy in mouth between glasses.  If you become nauseated, try consuming smaller amounts, or stretch out the time between glasses. Stop for 30-60 minutes, then slowly start back drinking.        OTHER INSTRUCTIONS  You will need a responsible adult at least 67 years of age to accompany you and drive you home. This person must remain in the waiting room during your  procedure. The hospital will cancel your procedure if you do not have a responsible adult with you.   1. Wear loose fitting clothing that is easily removed. 2. Leave jewelry and other valuables at home.  3. Remove all body piercing jewelry and leave at home. 4. Total time from sign-in until discharge is approximately 2-3 hours. 5. You should go home directly after your procedure and rest. You can resume normal activities the day after your procedure. 6. The day of your procedure you should not:  Drive  Make legal decisions  Operate machinery  Drink alcohol  Return to work   You may call the office (Dept: [phone]) before 5:00pm, or page the doctor on call [phone]) after 5:00pm, for further instructions, if necessary.   Insurance Information YOU WILL NEED TO CHECK WITH YOUR INSURANCE COMPANY FOR THE BENEFITS OF COVERAGE YOU HAVE FOR THIS PROCEDURE.  UNFORTUNATELY, NOT ALL INSURANCE COMPANIES HAVE BENEFITS TO COVER ALL OR PART OF THESE TYPES OF PROCEDURES.  IT IS YOUR RESPONSIBILITY TO CHECK YOUR BENEFITS, HOWEVER, WE WILL BE GLAD TO ASSIST YOU WITH ANY CODES YOUR INSURANCE COMPANY MAY NEED.    PLEASE NOTE THAT MOST INSURANCE COMPANIES WILL NOT COVER A SCREENING COLONOSCOPY FOR PEOPLE UNDER THE AGE OF 50  IF YOU HAVE [employer] INSURANCE, YOU MAY HAVE BENEFITS FOR A SCREENING COLONOSCOPY BUT IF POLYPS ARE FOUND THE DIAGNOSIS WILL CHANGE AND THEN YOU MAY HAVE A DEDUCTIBLE THAT WILL NEED TO BE MET. SO PLEASE MAKE SURE YOU CHECK YOUR BENEFITS FOR A SCREENING COLONOSCOPY AS WELL AS A DIAGNOSTIC COLONOSCOPY.

## 2020-11-14 NOTE — Progress Notes (Signed)
Ok to schedule.  Metformin: none morning of Insulin: half the night before  On prep day: Check CBG ac and hs as well (if they normally check their blood sugar) as if the patient feels like their blood sugar is off. Can use soda, juice (that's in CLEAR LIQUIDS) as needed for any low blood sugar.  Check CBG on arrival to endo unit.

## 2020-11-15 NOTE — Progress Notes (Signed)
ASA II thanks

## 2020-11-16 ENCOUNTER — Encounter: Payer: Self-pay | Admitting: *Deleted

## 2020-11-16 NOTE — Progress Notes (Signed)
Mailed letter to pt with diabetes medication adjustments.   

## 2020-12-13 ENCOUNTER — Other Ambulatory Visit (HOSPITAL_COMMUNITY)
Admission: RE | Admit: 2020-12-13 | Discharge: 2020-12-13 | Disposition: A | Discharge status: Home / Self Care | Payer: Medicare Other | Source: Ambulatory Visit | Attending: Internal Medicine | Admitting: Internal Medicine

## 2020-12-13 DIAGNOSIS — Z20822 Contact with and (suspected) exposure to covid-19: Secondary | ICD-10-CM | POA: Diagnosis present

## 2020-12-14 ENCOUNTER — Other Ambulatory Visit: Payer: Self-pay

## 2020-12-14 ENCOUNTER — Other Ambulatory Visit (HOSPITAL_COMMUNITY)
Admission: RE | Admit: 2020-12-14 | Discharge: 2020-12-14 | Disposition: A | Discharge status: Home / Self Care | Payer: Medicare Other | Source: Ambulatory Visit | Attending: Internal Medicine | Admitting: Internal Medicine

## 2020-12-14 DIAGNOSIS — Z20822 Contact with and (suspected) exposure to covid-19: Secondary | ICD-10-CM | POA: Insufficient documentation

## 2020-12-14 LAB — SARS CORONAVIRUS 2 (TAT 6-24 HRS): SARS Coronavirus 2: NEGATIVE

## 2020-12-15 ENCOUNTER — Ambulatory Visit (HOSPITAL_COMMUNITY): Payer: Medicare Other | Admitting: Anesthesiology

## 2020-12-15 ENCOUNTER — Ambulatory Visit (HOSPITAL_COMMUNITY)
Admission: RE | Admit: 2020-12-15 | Discharge: 2020-12-15 | Disposition: A | Discharge status: Home / Self Care | Payer: Medicare Other | Attending: Internal Medicine | Admitting: Internal Medicine

## 2020-12-15 ENCOUNTER — Other Ambulatory Visit: Payer: Self-pay

## 2020-12-15 ENCOUNTER — Encounter (HOSPITAL_COMMUNITY)
Admission: RE | Disposition: A | Discharge status: Home / Self Care | Payer: Self-pay | Source: Home / Self Care | Attending: Internal Medicine

## 2020-12-15 ENCOUNTER — Encounter (HOSPITAL_COMMUNITY): Payer: Self-pay

## 2020-12-15 DIAGNOSIS — Z79899 Other long term (current) drug therapy: Secondary | ICD-10-CM | POA: Insufficient documentation

## 2020-12-15 DIAGNOSIS — Z794 Long term (current) use of insulin: Secondary | ICD-10-CM | POA: Insufficient documentation

## 2020-12-15 DIAGNOSIS — Z89421 Acquired absence of other right toe(s): Secondary | ICD-10-CM | POA: Insufficient documentation

## 2020-12-15 DIAGNOSIS — Z89411 Acquired absence of right great toe: Secondary | ICD-10-CM | POA: Diagnosis not present

## 2020-12-15 DIAGNOSIS — Z89512 Acquired absence of left leg below knee: Secondary | ICD-10-CM | POA: Insufficient documentation

## 2020-12-15 DIAGNOSIS — Z7982 Long term (current) use of aspirin: Secondary | ICD-10-CM | POA: Diagnosis not present

## 2020-12-15 DIAGNOSIS — Z87891 Personal history of nicotine dependence: Secondary | ICD-10-CM | POA: Diagnosis not present

## 2020-12-15 DIAGNOSIS — K5730 Diverticulosis of large intestine without perforation or abscess without bleeding: Secondary | ICD-10-CM | POA: Insufficient documentation

## 2020-12-15 DIAGNOSIS — K635 Polyp of colon: Secondary | ICD-10-CM | POA: Diagnosis not present

## 2020-12-15 DIAGNOSIS — K648 Other hemorrhoids: Secondary | ICD-10-CM | POA: Insufficient documentation

## 2020-12-15 DIAGNOSIS — Z1211 Encounter for screening for malignant neoplasm of colon: Secondary | ICD-10-CM | POA: Diagnosis present

## 2020-12-15 DIAGNOSIS — Z7984 Long term (current) use of oral hypoglycemic drugs: Secondary | ICD-10-CM | POA: Diagnosis not present

## 2020-12-15 HISTORY — PX: COLONOSCOPY WITH PROPOFOL: SHX5780

## 2020-12-15 HISTORY — PX: POLYPECTOMY: SHX149

## 2020-12-15 LAB — GLUCOSE, CAPILLARY: Glucose-Capillary: 113 mg/dL — ABNORMAL HIGH (ref 70–99)

## 2020-12-15 SURGERY — COLONOSCOPY WITH PROPOFOL
Anesthesia: General

## 2020-12-15 MED ORDER — STERILE WATER FOR IRRIGATION IR SOLN
Status: DC | PRN
  Administered 2020-12-15: 100 mL

## 2020-12-15 MED ORDER — PROPOFOL 10 MG/ML IV BOLUS
INTRAVENOUS | Status: DC | PRN
  Administered 2020-12-15: 100 mg via INTRAVENOUS
  Administered 2020-12-15 (×2): 50 mg via INTRAVENOUS

## 2020-12-15 MED ORDER — CHLORHEXIDINE GLUCONATE CLOTH 2 % EX PADS: 6.0000 | MEDICATED_PAD | Freq: Once | CUTANEOUS | Status: DC

## 2020-12-15 MED ORDER — LIDOCAINE HCL (CARDIAC) PF 100 MG/5ML IV SOSY
PREFILLED_SYRINGE | INTRAVENOUS | Status: DC | PRN
  Administered 2020-12-15: 50 mg via INTRAVENOUS

## 2020-12-15 MED ORDER — LACTATED RINGERS IV SOLN
INTRAVENOUS | Status: DC
  Administered 2020-12-15: 12:00:00 1000 mL via INTRAVENOUS

## 2020-12-15 NOTE — Discharge Instructions (Addendum)
Colonoscopy Discharge Instructions  Read the instructions outlined below and refer to this sheet in the next few weeks. These discharge instructions provide you with general information on caring for yourself after you leave the hospital. Your doctor may also give you specific instructions. While your treatment has been planned according to the most current medical practices available, unavoidable complications occasionally occur.   ACTIVITY  You may resume your regular activity, but move at a slower pace for the next 24 hours.   Take frequent rest periods for the next 24 hours.   Walking will help get rid of the air and reduce the bloated feeling in your belly (abdomen).   No driving for 24 hours (because of the medicine (anesthesia) used during the test).    Do not sign any important legal documents or operate any machinery for 24 hours (because of the anesthesia used during the test).  NUTRITION  Drink plenty of fluids.   You may resume your normal diet as instructed by your doctor.   Begin with a light meal and progress to your normal diet. Heavy or fried foods are harder to digest and may make you feel sick to your stomach (nauseated).   Avoid alcoholic beverages for 24 hours or as instructed.  MEDICATIONS  You may resume your normal medications unless your doctor tells you otherwise.  WHAT YOU CAN EXPECT TODAY  Some feelings of bloating in the abdomen.   Passage of more gas than usual.   Spotting of blood in your stool or on the toilet paper.  IF YOU HAD POLYPS REMOVED DURING THE COLONOSCOPY:  No aspirin products for 7 days or as instructed.   No alcohol for 7 days or as instructed.   Eat a soft diet for the next 24 hours.  FINDING OUT THE RESULTS OF YOUR TEST Not all test results are available during your visit. If your test results are not back during the visit, make an appointment with your caregiver to find out the results. Do not assume everything is normal if  you have not heard from your caregiver or the medical facility. It is important for you to follow up on all of your test results.  SEEK IMMEDIATE MEDICAL ATTENTION IF:  You have more than a spotting of blood in your stool.   Your belly is swollen (abdominal distention).   You are nauseated or vomiting.   You have a temperature over 101.   You have abdominal pain or discomfort that is severe or gets worse throughout the day.   Your colonoscopy revealed 3 polyp(s) which I removed successfully. Await pathology results, my office will contact you. I recommend repeating colonoscopy in 5-10 years depending on patholgoy for surveillance purposes.   You also have diverticulosis and internal hemorrhoids. I would recommend increasing fiber in your diet or adding OTC Benefiber/Metamucil. Be sure to drink at least 4 to 6 glasses of water daily. Follow-up with GI as needed.  I hope you have a great rest of your week!  Hennie Duos. Marletta Lor, D.O. Gastroenterology and Hepatology [facility]   Diverticulosis  Diverticulosis is a condition that develops when small pouches (diverticula) form in the wall of the large intestine (colon). The colon is where water is absorbed and stool (feces) is formed. The pouches form when the inside layer of the colon pushes through weak spots in the outer layers of the colon. You may have a few pouches or many of them. The pouches usually do not cause  problems unless they become inflamed or infected. When this happens, the condition is called diverticulitis. What are the causes? The cause of this condition is not known. What increases the risk? The following factors may make you more likely to develop this condition:  Being older than age 63. Your risk for this condition increases with age. Diverticulosis is rare among people younger than age 28. By age 52, many people have it.  Eating a low-fiber diet.  Having frequent  constipation.  Being overweight.  Not getting enough exercise.  Smoking.  Taking over-the-counter pain medicines, like aspirin and ibuprofen.  Having a family history of diverticulosis. What are the signs or symptoms? In most people, there are no symptoms of this condition. If you do have symptoms, they may include:  Bloating.  Cramps in the abdomen.  Constipation or diarrhea.  Pain in the lower left side of the abdomen. How is this diagnosed? Because diverticulosis usually has no symptoms, it is most often diagnosed during an exam for other colon problems. The condition may be diagnosed by:  Using a flexible scope to examine the colon (colonoscopy).  Taking an X-ray of the colon after dye has been put into the colon (barium enema).  Having a CT scan. How is this treated? You may not need treatment for this condition. Your health care provider may recommend treatment to prevent problems. You may need treatment if you have symptoms or if you previously had diverticulitis. Treatment may include:  Eating a high-fiber diet.  Taking a fiber supplement.  Taking a live bacteria supplement (probiotic).  Taking medicine to relax your colon. Follow these instructions at home: Medicines  Take over-the-counter and prescription medicines only as told by your health care provider.  If told by your health care provider, take a fiber supplement or probiotic. Constipation prevention Your condition may cause constipation. To prevent or treat constipation, you may need to:  Drink enough fluid to keep your urine pale yellow.  Take over-the-counter or prescription medicines.  Eat foods that are high in fiber, such as beans, whole grains, and fresh fruits and vegetables.  Limit foods that are high in fat and processed sugars, such as fried or sweet foods.  General instructions  Try not to strain when you have a bowel movement.  Keep all follow-up visits as told by your health  care provider. This is important. Contact a health care provider if you:  Have pain in your abdomen.  Have bloating.  Have cramps.  Have not had a bowel movement in 3 days. Get help right away if:  Your pain gets worse.  Your bloating becomes very bad.  You have a fever or chills, and your symptoms suddenly get worse.  You vomit.  You have bowel movements that are bloody or black.  You have bleeding from your rectum. Summary  Diverticulosis is a condition that develops when small pouches (diverticula) form in the wall of the large intestine (colon).  You may have a few pouches or many of them.  This condition is most often diagnosed during an exam for other colon problems.  Treatment may include increasing the fiber in your diet, taking supplements, or taking medicines. This information is not intended to replace advice given to you by your health care provider. Make sure you discuss any questions you have with your health care provider. Document Revised: 07/15/2019 Document Reviewed: 07/15/2019 Elsevier Patient Education  2020 [employer].  Hemorrhoids Hemorrhoids are swollen veins in and around the rectum  or anus. There are two types of hemorrhoids:  Internal hemorrhoids. These occur in the veins that are just inside the rectum. They may poke through to the outside and become irritated and painful.  External hemorrhoids. These occur in the veins that are outside the anus and can be felt as a painful swelling or hard lump near the anus. Most hemorrhoids do not cause serious problems, and they can be managed with home treatments such as diet and lifestyle changes. If home treatments do not help the symptoms, procedures can be done to shrink or remove the hemorrhoids. What are the causes? This condition is caused by increased pressure in the anal area. This pressure may result from various things, including:  Constipation.  Straining to have a bowel  movement.  Diarrhea.  Pregnancy.  Obesity.  Sitting for long periods of time.  Heavy lifting or other activity that causes you to strain.  Anal sex.  Riding a bike for a long period of time. What are the signs or symptoms? Symptoms of this condition include:  Pain.  Anal itching or irritation.  Rectal bleeding.  Leakage of stool (feces).  Anal swelling.  One or more lumps around the anus. How is this diagnosed? This condition can often be diagnosed through a visual exam. Other exams or tests may also be done, such as:  An exam that involves feeling the rectal area with a gloved hand (digital rectal exam).  An exam of the anal canal that is done using a small tube (anoscope).  A blood test, if you have lost a significant amount of blood.  A test to look inside the colon using a flexible tube with a camera on the end (sigmoidoscopy or colonoscopy). How is this treated? This condition can usually be treated at home. However, various procedures may be done if dietary changes, lifestyle changes, and other home treatments do not help your symptoms. These procedures can help make the hemorrhoids smaller or remove them completely. Some of these procedures involve surgery, and others do not. Common procedures include:  Rubber band ligation. Rubber bands are placed at the base of the hemorrhoids to cut off their blood supply.  Sclerotherapy. Medicine is injected into the hemorrhoids to shrink them.  Infrared coagulation. A type of light energy is used to get rid of the hemorrhoids.  Hemorrhoidectomy surgery. The hemorrhoids are surgically removed, and the veins that supply them are tied off.  Stapled hemorrhoidopexy surgery. The surgeon staples the base of the hemorrhoid to the rectal wall. Follow these instructions at home: Eating and drinking   Eat foods that have a lot of fiber in them, such as whole grains, beans, nuts, fruits, and vegetables.  Ask your health care  provider about taking products that have added fiber (fiber supplements).  Reduce the amount of fat in your diet. You can do this by eating low-fat dairy products, eating less red meat, and avoiding processed foods.  Drink enough fluid to keep your urine pale yellow. Managing pain and swelling   Take warm sitz baths for 20 minutes, 3-4 times a day to ease pain and discomfort. You may do this in a bathtub or using a portable sitz bath that fits over the toilet.  If directed, apply ice to the affected area. Using ice packs between sitz baths may be helpful. ? Put ice in a plastic bag. ? Place a towel between your skin and the bag. ? Leave the ice on for 20 minutes, 2-3 times a  day. General instructions  Take over-the-counter and prescription medicines only as told by your health care provider.  Use medicated creams or suppositories as told.  Get regular exercise. Ask your health care provider how much and what kind of exercise is best for you. In general, you should do moderate exercise for at least 30 minutes on most days of the week (150 minutes each week). This can include activities such as walking, biking, or yoga.  Go to the bathroom when you have the urge to have a bowel movement. Do not wait.  Avoid straining to have bowel movements.  Keep the anal area dry and clean. Use wet toilet paper or moist towelettes after a bowel movement.  Do not sit on the toilet for long periods of time. This increases blood pooling and pain.  Keep all follow-up visits as told by your health care provider. This is important. Contact a health care provider if you have:  Increasing pain and swelling that are not controlled by treatment or medicine.  Difficulty having a bowel movement, or you are unable to have a bowel movement.  Pain or inflammation outside the area of the hemorrhoids. Get help right away if you have:  Uncontrolled bleeding from your rectum. Summary  Hemorrhoids are swollen  veins in and around the rectum or anus.  Most hemorrhoids can be managed with home treatments such as diet and lifestyle changes.  Taking warm sitz baths can help ease pain and discomfort.  In severe cases, procedures or surgery can be done to shrink or remove the hemorrhoids. This information is not intended to replace advice given to you by your health care provider. Make sure you discuss any questions you have with your health care provider. Document Revised: 05/14/2019 Document Reviewed: 05/07/2018 Elsevier Patient Education  2020 [employer].

## 2020-12-15 NOTE — H&P (Signed)
Primary Care Physician:  [facility] Primary Gastroenterologist:  Dr. Marletta Lor  Pre-Procedure History & Physical: HPI:  Donald Duran is a 69 y.o. male is here for a colonoscopy for colon cancer screening purposes.  Patient denies any family history of colorectal cancer.  No melena or hematochezia.  No abdominal pain or unintentional weight loss.  No change in bowel habits.  Overall feels well from a GI standpoint.  Past Medical History:  Diagnosis Date   Diabetes mellitus without complication (HCC)    Hypertension    Osteomyelitis (HCC)    Peripheral vascular disease (HCC)     Past Surgical History:  Procedure Laterality Date   ABDOMINAL AORTOGRAM W/LOWER EXTREMITY Right 01/17/2020   Procedure: ABDOMINAL AORTOGRAM W/LOWER EXTREMITY;  Surgeon: Cephus Shelling, MD;  Location: [facility] INVASIVE CV LAB;  Service: Cardiovascular;  Laterality: Right;   AMPUTATION Right 02/11/2020   Procedure: AMPUTATION FOURTH TOE;  Surgeon: Cephus Shelling, MD;  Location: [facility] OR;  Service: Vascular;  Laterality: Right;   AMPUTATION Right 05/05/2020   Procedure: AMPUTATION RIGHT GREAT TOE;  Surgeon: Larina Earthly, MD;  Location: [facility] OR;  Service: Vascular;  Laterality: Right;   LEG AMPUTATION BELOW KNEE Left 2017   Done at [facility] in [place] by Dr. Adan Sis   OPEN TREATMENT ORBITAL FLOOR BLOWOUT  1977   TOE AMPUTATION Left    TONSILLECTOMY      Prior to Admission medications   Medication Sig Start Date End Date Taking? Authorizing Provider  Ascorbic Acid (VITAMIN C) 1000 MG tablet Take 1,000 mg by mouth every morning.    Yes [provider]  atorvastatin (LIPITOR) 20 MG tablet Take 20 mg by mouth daily.   Yes [provider]  cholecalciferol (VITAMIN D3) 25 MCG (1000 UNIT) tablet Take 1,000 Units by mouth daily.   Yes [provider]  Coenzyme Q10 (COQ10) 50 MG CAPS Take 50 mg by mouth once a week.    Yes  [provider]  Insulin Glargine (BASAGLAR KWIKPEN) 100 UNIT/ML Inject 40 Units into the skin at bedtime.  04/25/20  Yes [provider]  lisinopril (ZESTRIL) 5 MG tablet Take 1 tablet (5 mg total) by mouth daily. Patient taking differently: Take 10 mg by mouth daily. 05/07/20  Yes Alwyn Ren, MD  metFORMIN (GLUCOPHAGE) 500 MG tablet Take 1 tablet (500 mg total) by mouth 2 (two) times daily with a meal. 01/20/20  Yes Mikhail, [place], DO  Multiple Vitamin (MULTIVITAMIN WITH MINERALS) TABS tablet Take 1 tablet by mouth daily.   Yes [provider]  Turmeric 500 MG CAPS Take 500 mg by mouth daily.    Yes [provider]  zinc gluconate 50 MG tablet Take 50 mg by mouth daily.   Yes [provider]  aspirin EC 81 MG tablet Take 81 mg by mouth at bedtime.     [provider]  glucose blood test strip Use as instructed 10/01/17   Jacquelin Hawking, PA-C  saccharomyces boulardii (FLORASTOR) 250 MG capsule Take 1 capsule (250 mg total) by mouth 2 (two) times daily. Patient not taking: No sig reported 05/06/20   Alwyn Ren, MD    Allergies as of 11/16/2020   (No Known Allergies)    Family History  Problem Relation Age of Onset   Diabetes Mother    Cancer Father     Social History   Socioeconomic History   Marital status: Single  Spouse name: Not on file   Number of children: Not on file   Years of education: Not on file   Highest education level: Not on file  Occupational History   Not on file  Tobacco Use   Smoking status: Former Smoker   Smokeless tobacco: Never Used   Tobacco comment: 50 years quit  Vaping Use   Vaping Use: Never used  Substance and Sexual Activity   Alcohol use: Yes    Comment: occasional beer   Drug use: No   Sexual activity: Not on file  Other Topics Concern   Not on file  Social History Narrative   Not on file   Social Determinants of Health   Financial Resource  Strain: Not on file  Food Insecurity: Not on file  Transportation Needs: Not on file  Physical Activity: Not on file  Stress: Not on file  Social Connections: Not on file  Intimate Partner Violence: Not on file    Review of Systems: See HPI, otherwise negative ROS  Impression/Plan: Donald Duran is here for a colonoscopy to be performed for colon cancer screening purposes.  The risks of the procedure including infection, bleed, or perforation as well as benefits, limitations, alternatives and imponderables have been reviewed with the patient. Questions have been answered. All parties agreeable.

## 2020-12-15 NOTE — Anesthesia Procedure Notes (Signed)
Date/Time: 12/15/2020 11:55 AM Performed by: Julian Reil, CRNA Pre-anesthesia Checklist: Patient identified, Emergency Drugs available, Suction available and Patient being monitored Patient Re-evaluated:Patient Re-evaluated prior to induction Oxygen Delivery Method: Nasal cannula Induction Type: IV induction Placement Confirmation: positive ETCO2

## 2020-12-15 NOTE — Op Note (Signed)
[facility] Patient Name: Keron Neenan Procedure Date: 12/15/2020 11:35 AM MRN: [id number] Date of Birth: September 19, 1951 Attending MD: Elon Alas. Edgar Frisk CSN: [id number] Age: 69 Admit Type: Outpatient Procedure:                Colonoscopy Indications:              Screening for colorectal malignant neoplasm Providers:                Elon Alas. Abbey Chatters, DO, Crystal Page, Wynonia Musty Tech, Technician Referring MD:              Medicines:                See the Anesthesia note for documentation of the                            administered medications Complications:            No immediate complications. Estimated Blood Loss:     Estimated blood loss was minimal. Procedure:                Pre-Anesthesia Assessment:                           - The anesthesia plan was to use monitored                            anesthesia care (MAC).                           After obtaining informed consent, the colonoscope                            was passed under direct vision. Throughout the                            procedure, the patient's blood pressure, pulse, and                            oxygen saturations were monitored continuously. The                            [id number]-H190DL ([id number]) scope was introduced through                            the anus and advanced to the the cecum, identified                            by appendiceal orifice and ileocecal valve. The                            colonoscopy was performed without difficulty. The                            patient tolerated the procedure well. The  quality                            of the bowel preparation was evaluated using the                            BBPS [facility] Bowel Preparation Scale) with scores                            of: Right Colon = 3, Transverse Colon = 3 and Left                            Colon = 3 (entire mucosa seen well with no residual                             staining, small fragments of stool or opaque                            liquid). The total BBPS score equals 9. Scope In: 11:53:18 AM Scope Out: 43:15:40 PM Scope Withdrawal Time: 0 hours 12 minutes 23 seconds  Total Procedure Duration: 0 hours 14 minutes 57 seconds  Findings:      The perianal and digital rectal examinations were normal.      Non-bleeding internal hemorrhoids were found during endoscopy.      Scattered small-mouthed diverticula were found in the entire colon.      Three sessile polyps were found in the sigmoid colon. The polyps were 4       to 7 mm in size. These polyps were removed with a cold snare. Resection       and retrieval were complete. Impression:               - Non-bleeding internal hemorrhoids.                           - Diverticulosis in the entire examined colon.                           - Three 4 to 7 mm polyps in the sigmoid colon,                            removed with a cold snare. Resected and retrieved. Moderate Sedation:      Per Anesthesia Care Recommendation:           - Patient has a contact number available for                            emergencies. The signs and symptoms of potential                            delayed complications were discussed with the                            patient. Return to normal activities tomorrow.  Written discharge instructions were provided to the                            patient.                           - Resume previous diet.                           - Continue present medications.                           - Await pathology results.                           - Repeat colonoscopy in 5-10 years for surveillance                            based on pathology results.                           - Return to GI clinic PRN. Procedure Code(s):        --- Professional ---                           [phone], Colonoscopy, flexible; with removal of                            tumor(s),  polyp(s), or other lesion(s) by snare                            technique Diagnosis Code(s):        --- Professional ---                           Z12.11, Encounter for screening for malignant                            neoplasm of colon                           K64.8, Other hemorrhoids                           K63.5, Polyp of colon                           K57.30, Diverticulosis of large intestine without                            perforation or abscess without bleeding CPT copyright 2019 American Medical Association. All rights reserved. The codes documented in this report are preliminary and upon coder review may  be revised to meet current compliance requirements. Elon Alas. Abbey Chatters, DO [place] Abbey Chatters, DO 12/15/2020 12:11:38 PM This report has been signed electronically. Number of Addenda: 0

## 2020-12-15 NOTE — Anesthesia Postprocedure Evaluation (Signed)
Anesthesia Post Note  Patient: Donald Duran  Procedure(s) Performed: COLONOSCOPY WITH PROPOFOL (N/A ) POLYPECTOMY INTESTINAL  Patient location during evaluation: Endoscopy Anesthesia Type: General Level of consciousness: awake and alert and oriented Pain management: pain level controlled Vital Signs Assessment: post-procedure vital signs reviewed and stable Respiratory status: spontaneous breathing, nonlabored ventilation and respiratory function stable Cardiovascular status: blood pressure returned to baseline and stable Postop Assessment: no apparent nausea or vomiting Anesthetic complications: no   No complications documented.   Last Vitals:  Vitals:   12/15/20 1025 12/15/20 1211  BP: 135/72 106/67  Pulse: 73   Resp: 12 14  Temp: 36.8 C 37 C  SpO2: 97% 95%    Last Pain:  Vitals:   12/15/20 1211  TempSrc: Oral  PainSc: 0-No pain                 Julian Reil

## 2020-12-15 NOTE — Transfer of Care (Signed)
Immediate Anesthesia Transfer of Care Note  Patient: Donald Duran  Procedure(s) Performed: COLONOSCOPY WITH PROPOFOL (N/A ) POLYPECTOMY INTESTINAL  Patient Location: Endoscopy Unit  Anesthesia Type:General  Level of Consciousness: drowsy  Airway & Oxygen Therapy: Patient Spontanous Breathing  Post-op Assessment: Report given to RN and Post -op Vital signs reviewed and stable  Post vital signs: Reviewed and stable  Last Vitals:  Vitals Value Taken Time  BP 106/67 12/15/20 1211  Temp 37 C 12/15/20 1211  Pulse    Resp 14 12/15/20 1211  SpO2 95 % 12/15/20 1211    Last Pain:  Vitals:   12/15/20 1211  TempSrc: Oral  PainSc: 0-No pain      Patients Stated Pain Goal: 3 (12/15/20 1025)  Complications: No complications documented.

## 2020-12-15 NOTE — Anesthesia Preprocedure Evaluation (Signed)
Anesthesia Evaluation  Patient identified by MRN, date of birth, ID band Patient awake    Reviewed: Allergy & Precautions, H&P , NPO status , Patient's Chart, lab work & pertinent test results, reviewed documented beta blocker date and time   Airway Mallampati: II  TM Distance: >3 FB Neck ROM: full    Dental no notable dental hx. (+) Teeth Intact   Pulmonary neg pulmonary ROS, former smoker,    Pulmonary exam normal breath sounds clear to auscultation       Cardiovascular Exercise Tolerance: Good hypertension, + Peripheral Vascular Disease   Rhythm:regular Rate:Normal     Neuro/Psych negative neurological ROS  negative psych ROS   GI/Hepatic negative GI ROS, Neg liver ROS,   Endo/Other  negative endocrine ROSdiabetes, Type 2  Renal/GU negative Renal ROS  negative genitourinary   Musculoskeletal   Abdominal   Peds  Hematology negative hematology ROS (+)   Anesthesia Other Findings   Reproductive/Obstetrics negative OB ROS                             Anesthesia Physical Anesthesia Plan  ASA: II  Anesthesia Plan: General   Post-op Pain Management:    Induction:   PONV Risk Score and Plan: Propofol infusion  Airway Management Planned:   Additional Equipment:   Intra-op Plan:   Post-operative Plan:   Informed Consent: I have reviewed the patients History and Physical, chart, labs and discussed the procedure including the risks, benefits and alternatives for the proposed anesthesia with the patient or authorized representative who has indicated his/her understanding and acceptance.     Dental Advisory Given  Plan Discussed with: CRNA  Anesthesia Plan Comments:         Anesthesia Quick Evaluation

## 2020-12-18 LAB — SURGICAL PATHOLOGY

## 2020-12-26 ENCOUNTER — Encounter (HOSPITAL_COMMUNITY): Payer: Self-pay | Admitting: Internal Medicine

## 2021-01-31 ENCOUNTER — Encounter (HOSPITAL_COMMUNITY): Payer: Medicare Other

## 2021-01-31 ENCOUNTER — Ambulatory Visit: Payer: Medicare Other

## 2021-03-06 ENCOUNTER — Ambulatory Visit (HOSPITAL_COMMUNITY)
Admission: RE | Admit: 2021-03-06 | Discharge: 2021-03-06 | Disposition: A | Discharge status: Home / Self Care | Payer: Medicare Other | Source: Ambulatory Visit | Attending: Vascular Surgery | Admitting: Vascular Surgery

## 2021-03-06 ENCOUNTER — Ambulatory Visit (INDEPENDENT_AMBULATORY_CARE_PROVIDER_SITE_OTHER): Payer: Medicare Other | Admitting: Physician Assistant

## 2021-03-06 ENCOUNTER — Other Ambulatory Visit: Payer: Self-pay

## 2021-03-06 VITALS — BP 125/81 | HR 89 | Temp 98.4°F | Resp 20 | Ht 71.5 in | Wt 189.2 lb

## 2021-03-06 DIAGNOSIS — S98131A Complete traumatic amputation of one right lesser toe, initial encounter: Secondary | ICD-10-CM | POA: Diagnosis not present

## 2021-03-06 DIAGNOSIS — I739 Peripheral vascular disease, unspecified: Secondary | ICD-10-CM

## 2021-03-06 NOTE — Progress Notes (Signed)
Office Note     CC:  follow up Requesting Provider:  [facility] [place] [facility]  HPI: Donald Duran is a 70 y.o. (03-Jun-1951) male who presents routine follow-up of peripheral arterial disease.  The patient is status post right great toe amputation on May 05, 2020 by Dr. Arbie Cookey. Prior to this, he underwent aortogram with bilateral lower extremity runoff (see below).  He had noncompressible ABIs with abnormal waveforms at the ankle.  He recently was refitted for left below-knee prosthesis.  He remains very active.  He is required to move pallets at work.  He denies right lower extremity claudication or rest pain.  He is status post right fourth toe amputation February 11, 2020 and prior left below the knee amputation. His last arteriogram was performed on January 17, 2020 secondary to right lower extremity tissue loss.  Findings as follows:  Aortogram showed no flow-limiting aortoiliac disease.  Right lower extremityarteriogram showed a widely patent common femoral, profunda, SFA, above and below-knee popliteal artery and two-vessel runoff via a widely patent anterior tibial and peroneal arteries. His posterior tibial was occluded throughout its course. He had excellent flow into the foot dominantly through the dorsalis pedis with even filling of the digital arteries distally in the toes.  No flow-limiting lesions that require intervention. He should have adequateinline flow for wound healingin the right foot.  Compliant with aspirin and statin. He is diabetic on insulin and Metformin. Former smoker.   Past Medical History:  Diagnosis Date  . Diabetes mellitus without complication (HCC)   . Hypertension   . Osteomyelitis (HCC)   . Peripheral vascular disease [facility])     Past Surgical History:  Procedure Laterality Date  . ABDOMINAL AORTOGRAM W/LOWER EXTREMITY Right 01/17/2020   Procedure: ABDOMINAL AORTOGRAM W/LOWER EXTREMITY;  Surgeon: Cephus Shelling, MD;  Location:  [facility] INVASIVE CV LAB;  Service: Cardiovascular;  Laterality: Right;  . AMPUTATION Right 02/11/2020   Procedure: AMPUTATION FOURTH TOE;  Surgeon: Cephus Shelling, MD;  Location: [facility] OR;  Service: Vascular;  Laterality: Right;  . AMPUTATION Right 05/05/2020   Procedure: AMPUTATION RIGHT GREAT TOE;  Surgeon: Larina Earthly, MD;  Location: [facility] OR;  Service: Vascular;  Laterality: Right;  . COLONOSCOPY WITH PROPOFOL N/A 12/15/2020   Procedure: COLONOSCOPY WITH PROPOFOL;  Surgeon: Lanelle Bal, DO;  Location: [facility] ENDO SUITE;  Service: Endoscopy;  Laterality: N/A;  12:15  . LEG AMPUTATION BELOW KNEE Left 2017   Done at [facility] in [place] by Dr. Adan Sis  . OPEN TREATMENT ORBITAL FLOOR BLOWOUT  1977  . POLYPECTOMY  12/15/2020   Procedure: POLYPECTOMY INTESTINAL;  Surgeon: Lanelle Bal, DO;  Location: [facility] ENDO SUITE;  Service: Endoscopy;;  . TOE AMPUTATION Left   . TONSILLECTOMY      Social History   Socioeconomic History  . Marital status: Single    Spouse name: Not on file  . Number of children: Not on file  . Years of education: Not on file  . Highest education level: Not on file  Occupational History  . Not on file  Tobacco Use  . Smoking status: Former Games developer  . Smokeless tobacco: Never Used  . Tobacco comment: 50 years quit  Vaping Use  . Vaping Use: Never used  Substance and Sexual Activity  . Alcohol use: Yes    Comment: occasional beer  . Drug use: No  . Sexual activity: Not on file  Other Topics Concern  . Not on file  Social History Narrative  . Not on file   Social Determinants of Health   Financial Resource Strain: Not on file  Food Insecurity: Not on file  Transportation Needs: Not on file  Physical Activity: Not on file  Stress: Not on file  Social Connections: Not on file  Intimate Partner Violence: Not on file   Family History  Problem Relation Age of Onset  . Diabetes Mother   . Cancer Father     Current Outpatient  Medications  Medication Sig Dispense Refill  . Ascorbic Acid (VITAMIN C) 1000 MG tablet Take 1,000 mg by mouth every morning.     Marland Kitchen aspirin EC 81 MG tablet Take 81 mg by mouth at bedtime.     Marland Kitchen atorvastatin (LIPITOR) 20 MG tablet Take 20 mg by mouth daily.    . cholecalciferol (VITAMIN D3) 25 MCG (1000 UNIT) tablet Take 1,000 Units by mouth daily.    . Coenzyme Q10 (COQ10) 50 MG CAPS Take 50 mg by mouth once a week.     Marland Kitchen glucose blood test strip Use as instructed 100 each 1  . Insulin Glargine (BASAGLAR KWIKPEN) 100 UNIT/ML Inject 40 Units into the skin at bedtime.     Marland Kitchen lisinopril (ZESTRIL) 5 MG tablet Take 1 tablet (5 mg total) by mouth daily. (Patient taking differently: Take 10 mg by mouth daily.) 30 tablet 2  . metFORMIN (GLUCOPHAGE) 500 MG tablet Take 1 tablet (500 mg total) by mouth 2 (two) times daily with a meal.    . Multiple Vitamin (MULTIVITAMIN WITH MINERALS) TABS tablet Take 1 tablet by mouth daily.    . Turmeric 500 MG CAPS Take 500 mg by mouth daily.     Marland Kitchen zinc gluconate 50 MG tablet Take 50 mg by mouth daily.     No current facility-administered medications for this visit.    No Known Allergies   REVIEW OF SYSTEMS:   [X]  denotes positive finding, [ ]  denotes negative finding Cardiac  Comments:  Chest pain or chest pressure:    Shortness of breath upon exertion:    Short of breath when lying flat:    Irregular heart rhythm:        Vascular    Pain in calf, thigh, or hip brought on by ambulation:    Pain in feet at night that wakes you up from your sleep:     Blood clot in your veins:    Leg swelling:         Pulmonary    Oxygen at home:    Productive cough:     Wheezing:         Neurologic    Sudden weakness in arms or legs:     Sudden numbness in arms or legs:     Sudden onset of difficulty speaking or slurred speech:    Temporary loss of vision in one eye:     Problems with dizziness:         Gastrointestinal    Blood in stool:     Vomited blood:          Genitourinary    Burning when urinating:     Blood in urine:        Psychiatric    Major depression:         Hematologic    Bleeding problems:    Problems with blood clotting too easily:        Skin    Rashes or ulcers:  Constitutional    Fever or chills:      PHYSICAL EXAMINATION: Vitals:   03/06/21 1206  BP: 125/81  Pulse: 89  Resp: 20  Temp: 98.4 F (36.9 C)  SpO2: 96%    General:  WDWN in NAD; vital signs documented above Gait: Unaided, no ataxia HENT: WNL, normocephalic Pulmonary: normal non-labored breathing Cardiac: regular HR Skin: without rashes Vascular Exam/Pulses: 2+ palpable right dorsalis pedis pulse Extremities: without ischemic changes, without Gangrene , without cellulitis; without open wounds; great toe and fourth toe amputation sites are well-healed.  Function and sensation intact superficial abrasion of right anterior shin noted healing nicely. Musculoskeletal: no muscle wasting or atrophy  Neurologic: A&O X 3;  No focal weakness or paresthesias are detected Psychiatric:  The pt has Normal affect.   Non-Invasive Vascular Imaging:   03/06/2021 Right: Resting right ankle-brachial index indicates noncompressible right  lower extremity arteries.     ASSESSMENT/PLAN:: 70 y.o. male here for follow up for peripheral arterial disease.  The patient is status post right great toe amputation secondary to osteomyelitis.  He is diabetic and this was most likely due to small vessel disease.  ABIs are noncompressible.  He has a palpable right dorsalis pedis pulse and his amputation site is well-healed. Discussed continued use of statin and aspirin and tight glucose control.  He is doing very well at maintaining active lifestyle.  He can follow-up with [place] as needed.  I told him we would be happy to assist in any way for further prosthetic needs.  Milinda Antis, PA-C [facility] [phone]  Clinic MD:  Chestine Spore

## 2021-08-12 IMAGING — MR MR FOOT*R* WO/W CM
9 series · 40 of 40 positions shown · IV contrast (gadavist)
Comparison: X-ray 01/14/2020

CLINICAL DATA: Osteomyelitis

EXAM:
MRI OF THE RIGHT FOREFOOT WITHOUT AND WITH CONTRAST
TECHNIQUE: Multiplanar, multisequence MR imaging of the right forefoot was
performed before and after the administration of intravenous
contrast.
CONTRAST:  8mL GADAVIST GADOBUTROL 1 MMOL/ML IV SOLN

[Series 4: T1 · coronal · right · 3.0mm · 0.47mm/px · 4 of 36 slices shown (1 of 2)]
[im 1/36]
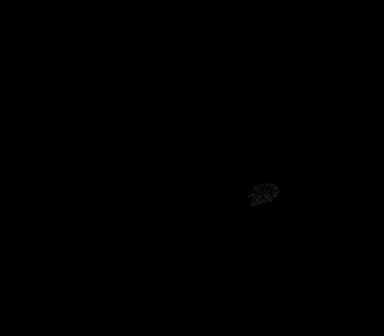
[im 12/36]
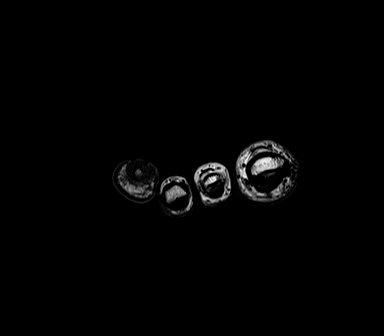
[im 24/36]
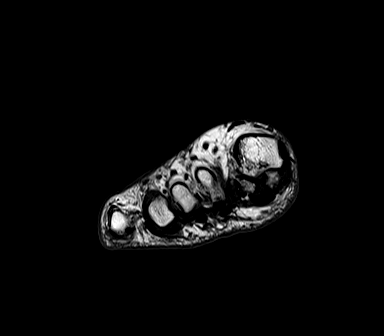
[im 36/36]
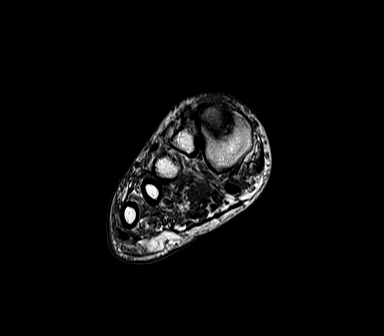

[Series 5: T2 fat-sat · coronal · right · 3.0mm · 0.49mm/px · 5 of 36 slices shown (1 of 2)]
[im 1/36]
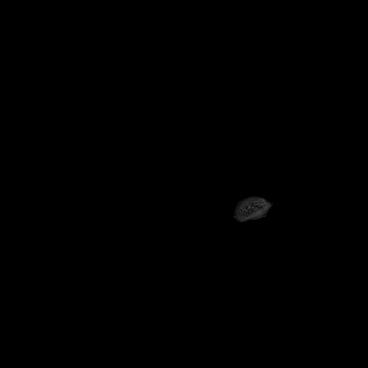
[im 9/36]
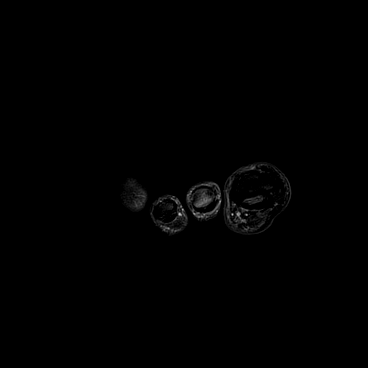
[im 18/36]
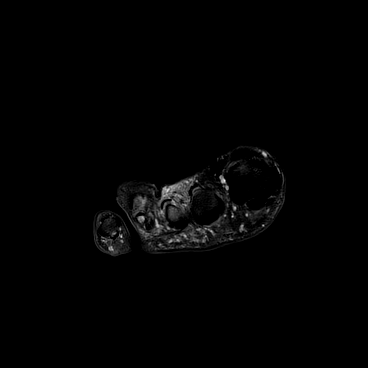
[im 27/36]
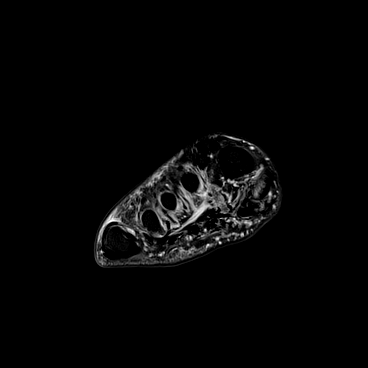
[im 36/36]
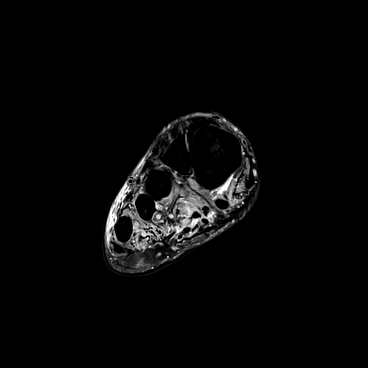

[Series 6: T1 · axial · right · 3.0mm · 0.52mm/px · z∈[-110,-39]mm · 3 of 20 slices shown (2 of 2)]
[im 1/20]
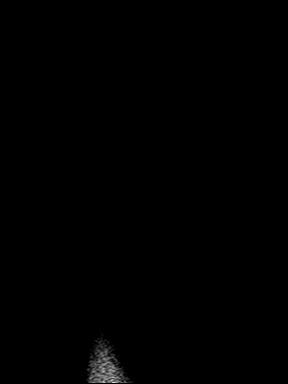
[im 10/20]
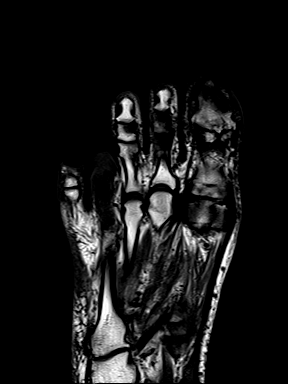
[im 20/20]
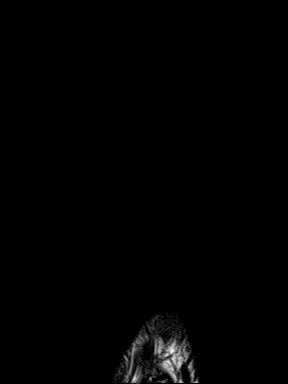

[Series 8: STIR · sagittal · right · 3.0mm · 0.70mm/px · 5 of 32 slices shown]
[im 1/32]
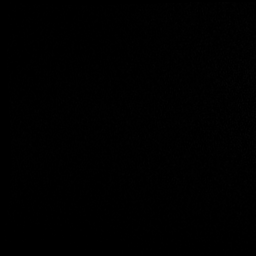
[im 8/32]
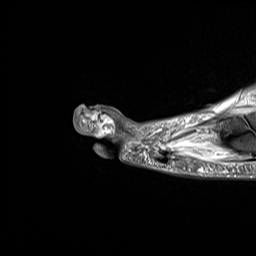
[im 16/32]
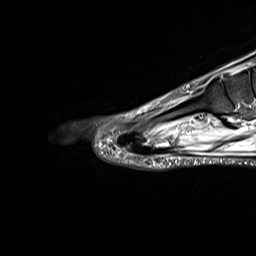
[im 24/32]
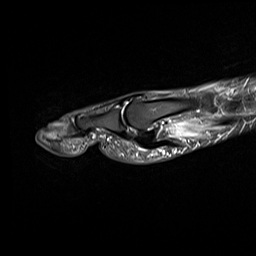
[im 32/32]
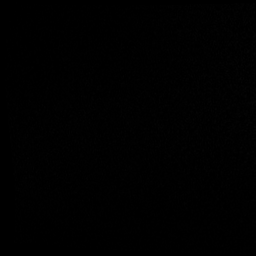

[Series 9: T2 fat-sat · axial · right · 3.0mm · 0.36mm/px · z∈[-99,-28]mm · 3 of 20 slices shown (2 of 2)]
[im 1/20]
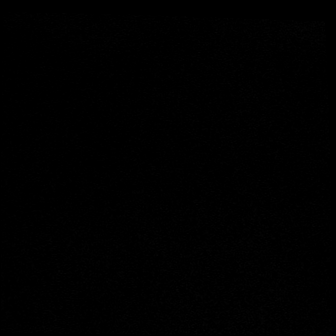
[im 10/20]
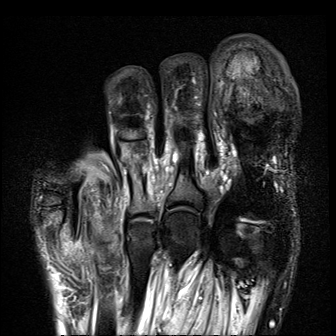
[im 20/20]
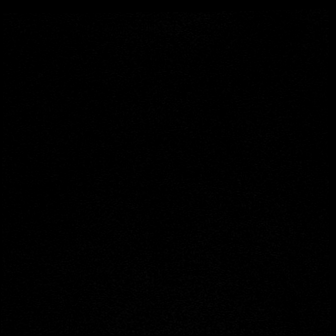

[Series 10: T1 fat-sat · coronal · non-contrast · right · 3.0mm · 0.59mm/px · 6 of 38 slices shown (1 of 3)]
[im 1/38]
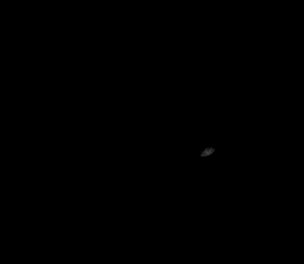
[im 8/38]
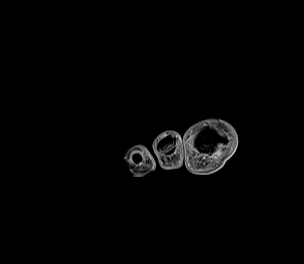
[im 15/38]
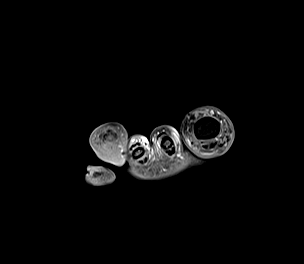
[im 23/38]
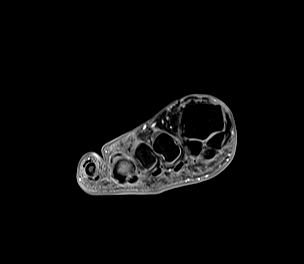
[im 30/38]
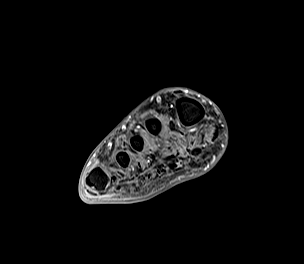
[im 38/38]
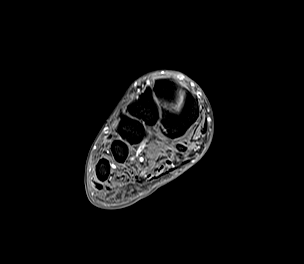

[Series 11: T1 fat-sat post-contrast · coronal · right · 3.0mm · 0.59mm/px · 6 of 38 slices shown]
[im 1/38]
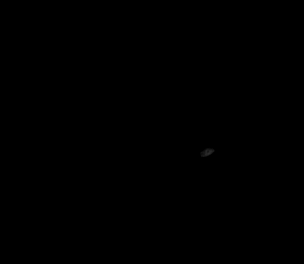
[im 8/38]
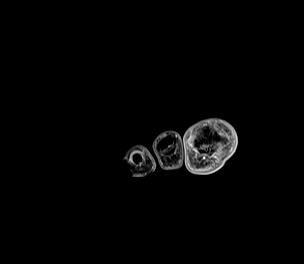
[im 15/38]
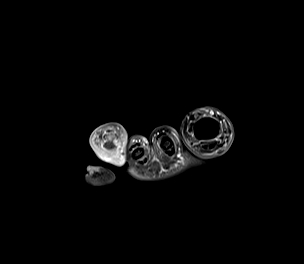
[im 23/38]
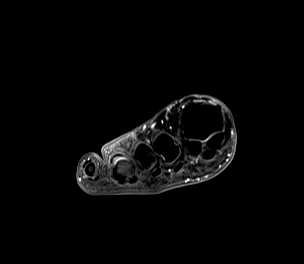
[im 30/38]
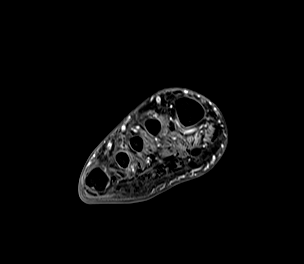
[im 38/38]
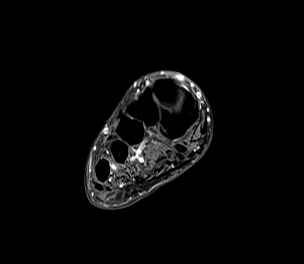

[Series 12: T1 fat-sat · sagittal · right · 3.0mm · 0.59mm/px · 5 of 32 slices shown (2 of 3)]
[im 1/32]
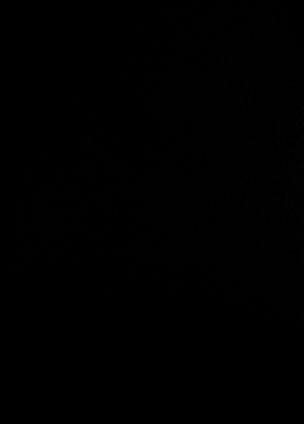
[im 8/32]
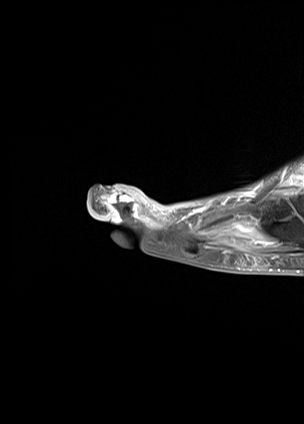
[im 16/32]
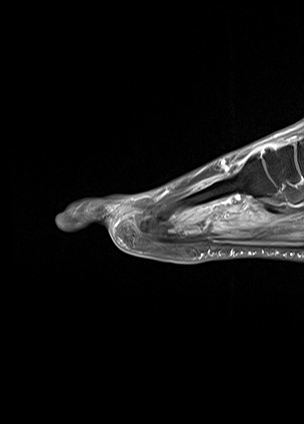
[im 24/32]
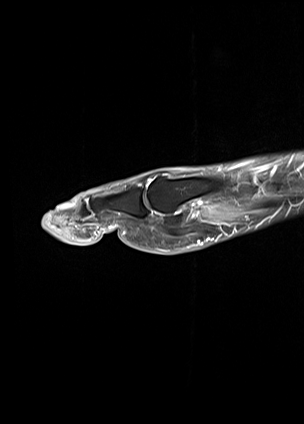
[im 32/32]
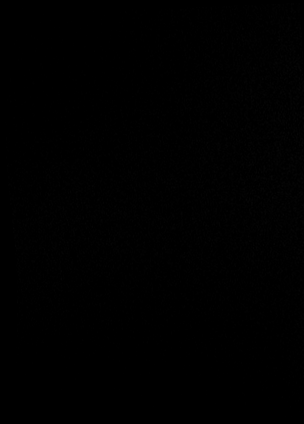

[Series 13: T1 fat-sat · axial · right · 3.0mm · 0.56mm/px · z∈[-109,-38]mm · 3 of 20 slices shown (3 of 3)]
[im 1/20]
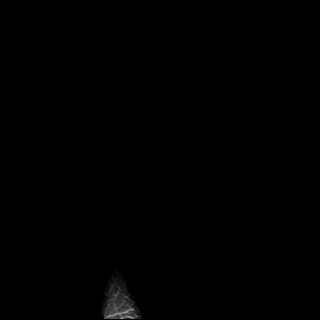
[im 10/20]
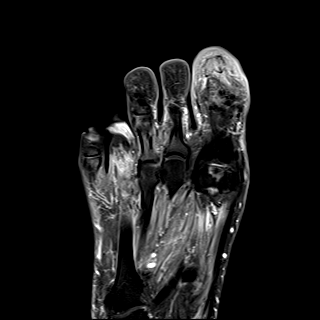
[im 20/20]
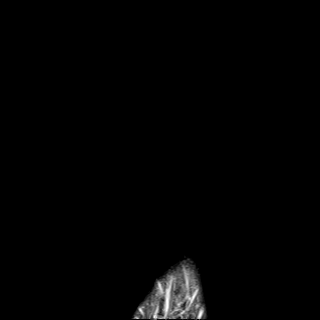

[40 of 40 positions shown; findings below may reference images not displayed]

FINDINGS: Bones/Joint/Cartilage

Extensive bone marrow edema and enhancement within the fourth digit
involving the proximal, middle, and distal phalanx with associated
low T1 signal changes compatible with acute osteomyelitis (series 6,
images 8-12). There are acute fractures of the proximal and middle
phalanx with 9 mm of dorsal displacement at the level of the PIP
joint (series 8, image 9). Ill-defined fluid with foci of
susceptibility suggesting soft tissue gas centered at the PIP joint
compatible with septic arthritis. Preservation of the normal marrow
signal at the fourth metatarsal head. No fourth MTP joint effusion.

Marrow edema and enhancement within the great toe distal phalanx
with confluent low T1 signal changes at the distal tuft (series 6,
images 10-12) compatible with acute osteomyelitis. Abnormal T1
marrow signal does not extend to the great toe IP joint. There is no
IP joint effusion. Normal marrow signal within the great toe
proximal phalanx.

Mild marrow edema without definite abnormal T1 signal in the distal
phalanx of the fifth digit. Enhancing soft tissue underlying the
nail of the fifth digit suggesting nailbed infection.

Scattered degenerative changes most prominent at the first MTP joint
where there are associated reactive subchondral marrow changes.

Ligaments

Intact Lisfranc ligament. Collateral ligaments of the foot appear
intact.

Muscles and Tendons

Diffuse edema like signal throughout the visualized intrinsic foot
musculature which may reflect denervation changes and/or myositis.
Tendinous structures appear grossly intact.

Soft tissues

Skin ulceration at the plantar lateral aspect of the fifth digit
with extensive edema and enhancement throughout the soft tissues of
the fourth digit. There is additional area of skin irregularity at
the plantar aspect of the great toe distal phalanx with soft tissue
edema and enhancement. Abnormal soft tissue noted under the nail of
the fifth digit.
IMPRESSION: 1. Acute osteomyelitis involving the proximal, middle, and distal
phalanx of the right fourth digit. Mildly displaced fractures of the
4th proximal and middle phalanxes. Ill-defined fluid at the fourth
PIP joint suspicious for septic arthritis.
2. Acute osteomyelitis of the great toe distal phalanx.
3. Enhancing soft tissue underlying the nail of the fifth digit
suggesting nailbed infection. Mild marrow edema within the fifth
digit distal phalanx without corresponding low T1 signal. Findings
may reflect reactive osteitis. Early acute osteomyelitis at this
site be difficult to exclude.
4. Skin ulcerations with cellulitis centered at the fourth digit and
right toe distal phalanx.
5. Diffuse edema like signal throughout the visualized intrinsic
foot musculature which may reflect denervation changes and/or
myositis.

These results will be called to the ordering clinician or
representative by the Radiologist Assistant, and communication
documented in the PACS or zVision Dashboard.

## 2022-04-13 ENCOUNTER — Emergency Department (HOSPITAL_COMMUNITY): Payer: Medicare Other

## 2022-04-13 ENCOUNTER — Other Ambulatory Visit: Payer: Self-pay

## 2022-04-13 ENCOUNTER — Encounter (HOSPITAL_COMMUNITY): Payer: Self-pay | Admitting: Emergency Medicine

## 2022-04-13 ENCOUNTER — Emergency Department (HOSPITAL_COMMUNITY)
Admission: EM | Admit: 2022-04-13 | Discharge: 2022-04-13 | Disposition: A | Discharge status: Home / Self Care | Payer: Medicare Other | Attending: Emergency Medicine | Admitting: Emergency Medicine

## 2022-04-13 DIAGNOSIS — Z794 Long term (current) use of insulin: Secondary | ICD-10-CM | POA: Insufficient documentation

## 2022-04-13 DIAGNOSIS — M79662 Pain in left lower leg: Secondary | ICD-10-CM | POA: Insufficient documentation

## 2022-04-13 DIAGNOSIS — Z7982 Long term (current) use of aspirin: Secondary | ICD-10-CM | POA: Diagnosis not present

## 2022-04-13 DIAGNOSIS — W19XXXA Unspecified fall, initial encounter: Secondary | ICD-10-CM | POA: Insufficient documentation

## 2022-04-13 DIAGNOSIS — R059 Cough, unspecified: Secondary | ICD-10-CM | POA: Insufficient documentation

## 2022-04-13 DIAGNOSIS — M25552 Pain in left hip: Secondary | ICD-10-CM | POA: Insufficient documentation

## 2022-04-13 DIAGNOSIS — T8789 Other complications of amputation stump: Secondary | ICD-10-CM

## 2022-04-13 LAB — CBC WITH DIFFERENTIAL/PLATELET
Abs Immature Granulocytes: 0.03 10*3/uL (ref 0.00–0.07)
Basophils Absolute: 0.1 10*3/uL (ref 0.0–0.1)
Basophils Relative: 1 %
Eosinophils Absolute: 0.1 10*3/uL (ref 0.0–0.5)
Eosinophils Relative: 1 %
HCT: 40 % (ref 39.0–52.0)
Hemoglobin: 14 g/dL (ref 13.0–17.0)
Immature Granulocytes: 0 %
Lymphocytes Relative: 19 %
Lymphs Abs: 2 10*3/uL (ref 0.7–4.0)
MCH: 31.4 pg (ref 26.0–34.0)
MCHC: 35 g/dL (ref 30.0–36.0)
MCV: 89.7 fL (ref 80.0–100.0)
Monocytes Absolute: 0.9 10*3/uL (ref 0.1–1.0)
Monocytes Relative: 9 %
Neutro Abs: 7.2 10*3/uL (ref 1.7–7.7)
Neutrophils Relative %: 70 %
Platelets: 184 10*3/uL (ref 150–400)
RBC: 4.46 MIL/uL (ref 4.22–5.81)
RDW: 12.7 % (ref 11.5–15.5)
WBC: 10.3 10*3/uL (ref 4.0–10.5)
nRBC: 0 % (ref 0.0–0.2)

## 2022-04-13 LAB — BASIC METABOLIC PANEL
Anion gap: 8 (ref 5–15)
BUN: 22 mg/dL (ref 8–23)
CO2: 24 mmol/L (ref 22–32)
Calcium: 8.6 mg/dL — ABNORMAL LOW (ref 8.9–10.3)
Chloride: 105 mmol/L (ref 98–111)
Creatinine, Ser: 1.15 mg/dL (ref 0.61–1.24)
GFR, Estimated: >60 mL/min (ref >60)
Glucose, Bld: 150 mg/dL — ABNORMAL HIGH (ref 70–99)
Potassium: 4.3 mmol/L (ref 3.5–5.1)
Sodium: 137 mmol/L (ref 135–145)

## 2022-04-13 LAB — C-REACTIVE PROTEIN: CRP: 2.3 mg/dL — ABNORMAL HIGH

## 2022-04-13 LAB — SEDIMENTATION RATE: Sed Rate: 56 mm/hr — ABNORMAL HIGH (ref 0–16)

## 2022-04-13 NOTE — ED Provider Notes (Signed)
?[facility] ?Provider Note ? ? ?CSN: [id number] ?Arrival date & time: 04/13/22  1448 ? ?  ? ?History ? ?Chief Complaint  ?Patient presents with  ? Leg Pain  ? ? ?Donald Duran is a 71 y.o. male. ? ? ?Leg Pain ?Patient presents with pain on the left lower extremity.  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  More pain at the stump.  Has had somewhat acute on chronic drainage.  Pain is worse with standing and walking on it.  States that normally is able to work at 6 hours without problem but has been down to 1 hour to recently.  Did have a fall about 10 days ago but states he did not hit it just landed on the left hip.  Mild pain in the hip at that time.  States a few days ago did have some chills but only for that 1 day.  Has had a slight cough at times. ? ?Home Medications ?Prior to Admission medications   ?Medication Sig Start Date End Date Taking? Authorizing Provider  ?Ascorbic Acid (VITAMIN C) 1000 MG tablet Take 1,000 mg by mouth every morning.    Yes [provider]  ?aspirin EC 81 MG tablet Take 81 mg by mouth at bedtime.    Yes [provider]  ?atorvastatin (LIPITOR) 20 MG tablet Take 20 mg by mouth 2 (two) times a week.   Yes [provider]  ?cholecalciferol (VITAMIN D3) 25 MCG (1000 UNIT) tablet Take 1,000 Units by mouth daily.   Yes [provider]  ?Insulin Glargine (BASAGLAR KWIKPEN) 100 UNIT/ML Inject 40 Units into the skin at bedtime.  04/25/20  Yes [provider]  ?lisinopril (ZESTRIL) 20 MG tablet Take 20 mg by mouth daily.   Yes [provider]  ?metFORMIN (GLUCOPHAGE) 500 MG tablet Take 1 tablet (500 mg total) by mouth 2 (two) times daily with a meal. 01/20/20  Yes Cristal Ford, DO  ?Multiple Vitamin (MULTIVITAMIN WITH MINERALS) TABS tablet Take 1 tablet by mouth daily.   Yes [provider]  ?Turmeric 500 MG CAPS Take 500 mg by mouth daily.    Yes [provider]  ?zinc gluconate 50  MG tablet Take 50 mg by mouth daily.   Yes [provider]  ?Blood Pressure Monitor DEVI See admin instructions. 08/11/19   [provider]  ?glucose blood test strip Use as instructed 10/01/17   Soyla Dryer, PA-C  ?   ? ?Allergies    ?Patient has no known allergies.   ? ?Review of Systems   ?Review of Systems  ?Constitutional:  Positive for chills. Negative for appetite change.  ?Respiratory:  Negative for shortness of breath.   ?Skin:  Positive for wound.  ?Neurological:  Negative for weakness.  ? ?Physical Exam ?Updated Vital Signs ?BP (!) 149/86 (BP Location: Right Arm)   Pulse 72   Temp 97.7 ?F (36.5 ?C) (Oral)   Resp 17   Ht 5' 11.5" (1.816 m)   Wt 86 kg   SpO2 96%   BMI 26.07 kg/m?  ?Physical Exam ?Vitals and nursing note reviewed.  ?Cardiovascular:  ?   Rate and Rhythm: Regular rhythm.  ?Pulmonary:  ?   Effort: Pulmonary effort is normal.  ?Musculoskeletal:     ?   General: Tenderness present.  ?   Comments: Some tenderness to stump on left lower leg.  Mild discolored treated in the skin.  No drainage.  No fluctuance.  ?Skin: ?  General: Skin is warm.  ?Neurological:  ?   Mental Status: He is alert and oriented to person, place, and time.  ? ? ?ED Results / Procedures / Treatments   ?Labs ?(all labs ordered are listed, but only abnormal results are displayed) ?Labs Reviewed  ?BASIC METABOLIC PANEL - Abnormal; Notable for the following components:  ?    Result Value  ? Glucose, Bld 150 (*)   ? Calcium 8.6 (*)   ? All other components within normal limits  ?SEDIMENTATION RATE - Abnormal; Notable for the following components:  ? Sed Rate 56 (*)   ? All other components within normal limits  ?C-REACTIVE PROTEIN - Abnormal; Notable for the following components:  ? CRP 2.3 (*)   ? All other components within normal limits  ?CBC WITH DIFFERENTIAL/PLATELET  ? ? ?EKG ?None ? ?Radiology ?DG Knee 1-2 Views Left ? ?Result Date: 04/13/2022 ?CLINICAL DATA:  Pain at the end of the BKA stump,  worsening. Surgery done 5 years ago. EXAM: LEFT KNEE - 1-2 VIEW COMPARISON:  Previous images from 10/03/2020 are not available. FINDINGS: Amputation of the left leg from the proximal shafts distal. The amputated margins of the tibia and fibula are smooth. There is no bone resorption to suggest osteomyelitis. No fracture.  No bone lesion. Knee joint is normally aligned. Mild medial compartment narrowing. Small marginal osteophytes from the mediolateral compartments. No joint effusion. Femoral, popliteal and proximal calf arterial vascular calcifications. No soft tissue air. IMPRESSION: 1. No fracture.  No acute finding.  No evidence of osteomyelitis. Electronically Signed   By: Lajean Manes M.D.   On: 04/13/2022 15:29   ? ?Procedures ?Procedures  ? ? ?Medications Ordered in ED ?Medications - No data to display ? ?ED Course/ Medical Decision Making/ A&P ?  ?                        ?Medical Decision Making ?Amount and/or Complexity of Data Reviewed ?Labs: ordered. ?Radiology: ordered. ? ?Patient with previous left below the knee amputation.  Worried about infection.  Has some mild erythema at the site.  Is had drainage on and off.  White count reassuring.  No fever.  Sed rate mildly elevated and CRP still pending.  X-ray independently interpreted showed no signs of osteomyelitis.  More likely I think this is either superficial infection but more likely just irritation at the end.  Do not think we need antibiotics at this time.  Appears stable for outpatient follow-up.  Osteomyelitis felt less likely. ? ? ? ? ? ? ?Final Clinical Impression(s) / ED Diagnoses ?Final diagnoses:  ?Pain of left lower leg  ? ? ?Rx / DC Orders ?ED Discharge Orders   ? ? None  ? ?  ? ? ?  ?Davonna Belling, MD ?04/13/22 2351 ? ?

## 2022-04-13 NOTE — ED Triage Notes (Signed)
Pt reports falling in the dark about 10 days ago on left hip. Pt had BKA to left leg and now having stump pain. Mild residual pain to hip but majority of pain to stump. 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  ?

## 2022-04-13 NOTE — Discharge Instructions (Signed)
Watch for more signs of infection.  Overall the x-ray and blood work was reassuring. ?

## 2023-10-15 ENCOUNTER — Ambulatory Visit: Payer: Medicare Other | Admitting: Nurse Practitioner

## 2023-12-15 ENCOUNTER — Ambulatory Visit: Payer: Medicare Other | Admitting: Nurse Practitioner

## 2024-03-16 ENCOUNTER — Ambulatory Visit: Payer: Medicare Other | Admitting: Nurse Practitioner
